# Patient Record
Sex: Male | Born: 1960 | Race: White | Hispanic: No | Marital: Married | Smoking: Former smoker
Health system: Southern US, Community
[De-identification: ages and names within clinical notes are randomized; demographics above are authoritative.]

## PROBLEM LIST (undated history)

## (undated) DIAGNOSIS — K219 Gastro-esophageal reflux disease without esophagitis: Secondary | ICD-10-CM

## (undated) DIAGNOSIS — K759 Inflammatory liver disease, unspecified: Secondary | ICD-10-CM

## (undated) DIAGNOSIS — Z87442 Personal history of urinary calculi: Secondary | ICD-10-CM

## (undated) DIAGNOSIS — D649 Anemia, unspecified: Secondary | ICD-10-CM

## (undated) DIAGNOSIS — I10 Essential (primary) hypertension: Secondary | ICD-10-CM

## (undated) DIAGNOSIS — G4730 Sleep apnea, unspecified: Secondary | ICD-10-CM

## (undated) DIAGNOSIS — L309 Dermatitis, unspecified: Secondary | ICD-10-CM

## (undated) DIAGNOSIS — R112 Nausea with vomiting, unspecified: Secondary | ICD-10-CM

## (undated) DIAGNOSIS — Z9889 Other specified postprocedural states: Secondary | ICD-10-CM

## (undated) DIAGNOSIS — C801 Malignant (primary) neoplasm, unspecified: Secondary | ICD-10-CM

## (undated) HISTORY — DX: Gastro-esophageal reflux disease without esophagitis: K21.9

## (undated) HISTORY — DX: Personal history of urinary calculi: Z87.442

## (undated) HISTORY — DX: Anemia, unspecified: D64.9

## (undated) HISTORY — DX: Essential (primary) hypertension: I10

---

## 1992-08-22 HISTORY — PX: WISDOM TOOTH EXTRACTION: SHX21

## 2008-06-25 ENCOUNTER — Encounter: Payer: Self-pay | Admitting: Internal Medicine

## 2008-06-30 ENCOUNTER — Ambulatory Visit: Payer: Self-pay

## 2008-06-30 ENCOUNTER — Encounter: Payer: Self-pay | Admitting: Internal Medicine

## 2008-07-18 ENCOUNTER — Ambulatory Visit: Payer: Self-pay | Admitting: Internal Medicine

## 2008-07-18 DIAGNOSIS — Z87442 Personal history of urinary calculi: Secondary | ICD-10-CM

## 2008-07-18 DIAGNOSIS — B1910 Unspecified viral hepatitis B without hepatic coma: Secondary | ICD-10-CM | POA: Insufficient documentation

## 2008-07-18 DIAGNOSIS — D649 Anemia, unspecified: Secondary | ICD-10-CM

## 2008-07-18 DIAGNOSIS — J45909 Unspecified asthma, uncomplicated: Secondary | ICD-10-CM | POA: Insufficient documentation

## 2008-07-18 DIAGNOSIS — D509 Iron deficiency anemia, unspecified: Secondary | ICD-10-CM

## 2008-07-18 LAB — CONVERTED CEMR LAB
Albumin: 3.3 g/dL — ABNORMAL LOW (ref 3.5–5.2)
Alkaline Phosphatase: 88 units/L (ref 39–117)
CO2: 29 meq/L (ref 19–32)
Eosinophils Relative: 2.7 % (ref 0.0–5.0)
GFR calc Af Amer: 103 mL/min
GFR calc non Af Amer: 86 mL/min
Glucose, Bld: 129 mg/dL — ABNORMAL HIGH (ref 70–99)
HCT: 41.3 % (ref 39.0–52.0)
Hepatitis B Surface Ag: POSITIVE — AB
INR: 1.1 — ABNORMAL HIGH (ref 0.8–1.0)
Monocytes Absolute: 0.5 10*3/uL (ref 0.1–1.0)
Monocytes Relative: 15.5 % — ABNORMAL HIGH (ref 3.0–12.0)
Neutrophils Relative %: 39.1 % — ABNORMAL LOW (ref 43.0–77.0)
Platelets: 199 10*3/uL (ref 150–400)
Potassium: 4.1 meq/L (ref 3.5–5.1)
RBC / HPF: NONE SEEN
RBC: 4.61 M/uL (ref 4.22–5.81)
Sodium: 139 meq/L (ref 135–145)
Specific Gravity, Urine: 1.015 (ref 1.000–1.03)
Total Protein, Urine: NEGATIVE mg/dL
Total Protein: 6.7 g/dL (ref 6.0–8.3)
Urine Glucose: NEGATIVE mg/dL
WBC: 3 10*3/uL — ABNORMAL LOW (ref 4.5–10.5)

## 2008-07-20 ENCOUNTER — Encounter: Payer: Self-pay | Admitting: Internal Medicine

## 2008-08-26 ENCOUNTER — Ambulatory Visit: Payer: Self-pay | Admitting: Internal Medicine

## 2008-08-26 LAB — CONVERTED CEMR LAB
ALT: 25 units/L (ref 0–53)
AST: 25 units/L (ref 0–37)
Albumin: 3.5 g/dL (ref 3.5–5.2)
Basophils Absolute: 0 10*3/uL (ref 0.0–0.1)
Basophils Relative: 0.5 % (ref 0.0–3.0)
Eosinophils Relative: 2.2 % (ref 0.0–5.0)
Hemoglobin: 15 g/dL (ref 13.0–17.0)
Hep A Total Ab: NEGATIVE
Hepatitis B Surface Ag: POSITIVE — AB
Lymphocytes Relative: 22.7 % (ref 12.0–46.0)
MCHC: 35.5 g/dL (ref 30.0–36.0)
Neutro Abs: 3.8 10*3/uL (ref 1.4–7.7)
RBC: 4.62 M/uL (ref 4.22–5.81)
Total Protein: 6.5 g/dL (ref 6.0–8.3)

## 2008-08-28 ENCOUNTER — Encounter: Payer: Self-pay | Admitting: Internal Medicine

## 2008-11-03 ENCOUNTER — Ambulatory Visit: Payer: Self-pay | Admitting: Internal Medicine

## 2008-11-03 LAB — CONVERTED CEMR LAB
ALT: 19 units/L (ref 0–53)
AST: 19 units/L (ref 0–37)
Albumin: 3.6 g/dL (ref 3.5–5.2)
Alkaline Phosphatase: 33 units/L — ABNORMAL LOW (ref 39–117)
Total Protein: 6.4 g/dL (ref 6.0–8.3)

## 2008-11-05 ENCOUNTER — Encounter: Payer: Self-pay | Admitting: Internal Medicine

## 2008-11-24 ENCOUNTER — Emergency Department (HOSPITAL_COMMUNITY): Admission: EM | Admit: 2008-11-24 | Discharge: 2008-11-24 | Payer: Self-pay | Admitting: Emergency Medicine

## 2010-03-22 ENCOUNTER — Ambulatory Visit: Payer: Self-pay | Admitting: Internal Medicine

## 2010-03-22 DIAGNOSIS — A09 Infectious gastroenteritis and colitis, unspecified: Secondary | ICD-10-CM | POA: Insufficient documentation

## 2010-03-22 LAB — CONVERTED CEMR LAB
ALT: 19 units/L (ref 0–53)
AST: 18 units/L (ref 0–37)
BUN: 16 mg/dL (ref 6–23)
Basophils Absolute: 0 10*3/uL (ref 0.0–0.1)
Basophils Relative: 0.3 % (ref 0.0–3.0)
CO2: 28 meq/L (ref 19–32)
Creatinine, Ser: 0.8 mg/dL (ref 0.4–1.5)
Eosinophils Absolute: 0.1 10*3/uL (ref 0.0–0.7)
GFR calc non Af Amer: 111.35 mL/min (ref >60)
HCT: 41.1 % (ref 39.0–52.0)
Hemoglobin: 14.5 g/dL (ref 13.0–17.0)
Hep A Total Ab: POSITIVE — AB
Hep B S Ab: POSITIVE — AB
Lymphs Abs: 1.6 10*3/uL (ref 0.7–4.0)
MCHC: 35.3 g/dL (ref 30.0–36.0)
MCV: 91 fL (ref 78.0–100.0)
Neutro Abs: 8.5 10*3/uL — ABNORMAL HIGH (ref 1.4–7.7)
RDW: 13 % (ref 11.5–14.6)
Total Bilirubin: 0.6 mg/dL (ref 0.3–1.2)

## 2010-03-23 ENCOUNTER — Encounter: Payer: Self-pay | Admitting: Internal Medicine

## 2010-03-23 ENCOUNTER — Telehealth: Payer: Self-pay | Admitting: Internal Medicine

## 2010-03-24 ENCOUNTER — Encounter: Payer: Self-pay | Admitting: Internal Medicine

## 2010-09-01 ENCOUNTER — Encounter: Payer: Self-pay | Admitting: Internal Medicine

## 2010-09-01 ENCOUNTER — Ambulatory Visit
Admission: RE | Admit: 2010-09-01 | Discharge: 2010-09-01 | Payer: Self-pay | Source: Home / Self Care | Attending: Internal Medicine | Admitting: Internal Medicine

## 2010-09-01 DIAGNOSIS — L03119 Cellulitis of unspecified part of limb: Secondary | ICD-10-CM

## 2010-09-01 DIAGNOSIS — A4902 Methicillin resistant Staphylococcus aureus infection, unspecified site: Secondary | ICD-10-CM | POA: Insufficient documentation

## 2010-09-01 DIAGNOSIS — L02419 Cutaneous abscess of limb, unspecified: Secondary | ICD-10-CM | POA: Insufficient documentation

## 2010-09-02 ENCOUNTER — Encounter: Payer: Self-pay | Admitting: Internal Medicine

## 2010-09-02 ENCOUNTER — Ambulatory Visit
Admission: RE | Admit: 2010-09-02 | Discharge: 2010-09-02 | Payer: Self-pay | Source: Home / Self Care | Attending: Internal Medicine | Admitting: Internal Medicine

## 2010-09-21 NOTE — Letter (Signed)
Summary: Results Follow-up Letter  [facility]  [address] [place], [place] [address]   Phone: [phone]  Fax: [phone]    03/24/2010  [address] [place], [place]  [address]  Dear Mr. STYER,   The following are the results of your recent test(s):  Test     Result     stool test     positive for giardia, the antibiotic should kill it  _________________________________________________________  Please call for an appointment in 2-3 weeks _________________________________________________________ _________________________________________________________ _________________________________________________________  Sincerely,  Sanda Linger MD [facility]

## 2010-09-21 NOTE — Assessment & Plan Note (Signed)
Summary: DIARRHEA/NWS   Vital Signs:  Patient profile:   50 year old male Height:      71 inches Weight:      190 pounds BMI:     26.60 O2 Sat:      95 % on Room air Temp:     97.8 degrees F oral Pulse rate:   65 / minute Pulse rhythm:   regular Resp:     16 per minute BP sitting:   126 / 70  (left arm) Cuff size:   large  Vitals Entered By: Rock Nephew CMA (March 22, 2010 10:14 AM)  Nutrition Counseling: Patient's BMI is greater than 25 and therefore counseled on weight management options.  O2 Flow:  Room air CC: Patient c/o diarrhea. nasua x 2wk, Diarrhea Is Patient Diabetic? No Pain Assessment Patient in pain? no        Primary Care Provider:  Etta Grandchild MD  CC:  Patient c/o diarrhea. nasua x 2wk and Diarrhea.  History of Present Illness:  Diarrhea      This is a 50 year old man who presents with Diarrhea.  The symptoms began 2 weeks ago.  The severity is described as moderate.  The patient reports >6 stools per day, watery/unformed stools, malodorous stools, fecal urgency, fecal soiling, and abrupt onset of symptoms, but denies voluminous stools, blood in stool, mucus in stool, greasy stools, alternating diarrhea/constipation, nocturnal diarrhea, fasting diarrhea, bloating, gassiness, and gradual onset of symptoms.  Associated symptoms include abdominal pain and nausea.  The patient denies fever, abdominal cramps, vomiting, lightheadedness, increased thirst, weight loss, joint pains, mouth ulcers, and eye redness.  The symptoms are better with hypomotility agents.  Patient's risk factors for diarrhea include recent antibiotic use.    Preventive Screening-Counseling & Management  Alcohol-Tobacco     Alcohol drinks/day: 1     Alcohol type: spirits     >5/day in last 3 mos: no     Alcohol Counseling: to STOP drinking     Feels need to cut down: no     Feels annoyed by complaints: no     Feels guilty re: drinking: no     Needs 'eye opener' in am: no  Smoking Status: quit > 6 months     Year Quit: 1999     Pack years: 15     Passive Smoke Exposure: no     Tobacco Counseling: to remain off tobacco products  Hep-HIV-STD-Contraception     Hepatitis Risk: risk noted     Hepatitis Risk Counseling: to avoid increased hepatitis risk     HIV Risk: no risk noted     STD Risk: no risk noted      Sexual History:  currently monogamous.        Drug Use:  no.        Blood Transfusions:  no.    Clinical Review Panels:  CBC   WBC:  5.6 (08/26/2008)   RBC:  4.62 (08/26/2008)   Hgb:  15.0 (08/26/2008)   Hct:  42.3 (08/26/2008)   Platelets:  218 (08/26/2008)   MCV  91.5 (08/26/2008)   MCHC  35.5 (08/26/2008)   RDW  13.2 (08/26/2008)   PMN:  66.7 (08/26/2008)   Lymphs:  22.7 (08/26/2008)   Monos:  7.9 (08/26/2008)   Eosinophils:  2.2 (08/26/2008)   Basophil:  0.5 (08/26/2008)  Complete Metabolic Panel   Glucose:  129 (07/18/2008)   Sodium:  139 (07/18/2008)   Potassium:  4.1 (  07/18/2008)   Chloride:  105 (07/18/2008)   CO2:  29 (07/18/2008)   BUN:  10 (07/18/2008)   Creatinine:  1.0 (07/18/2008)   Albumin:  3.6 (11/03/2008)   Total Protein:  6.4 (11/03/2008)   Calcium:  8.8 (07/18/2008)   Total Bili:  1.2 (11/03/2008)   Alk Phos:  33 (11/03/2008)   SGPT (ALT):  19 (11/03/2008)   SGOT (AST):  19 (11/03/2008)   Medications Prior to Update: 1)  None  Current Medications (verified): 1)  Metronidazole 500 Mg Tabs (Metronidazole) .... One By Mouth Three Times A Day With Food-No Alcohol!  Allergies (verified): 1)  ! Penicillin 2)  ! Bactrim 3)  ! Sulfa  Past History:  Past Medical History: Last updated: 07/18/2008 Anemia-iron deficiency Asthma Hypertension Nephrolithiasis, hx of  Past Surgical History: Last updated: 07/18/2008 Denies surgical history  Family History: Last updated: 08/26/2008 Family History Diabetes 1st degree relative Family History of Colon CA 1st degree relative <60  Social History: Last  updated: 03/22/2010 Occupation: Programme researcher, broadcasting/film/video Married Drug use-no Regular exercise-no Alcohol use-yes  Risk Factors: Alcohol Use: 1 (03/22/2010) >5 drinks/d w/in last 3 months: no (03/22/2010) Exercise: no (07/18/2008)  Risk Factors: Smoking Status: quit > 6 months (03/22/2010) Passive Smoke Exposure: no (03/22/2010)  Social History: Occupation: Programme researcher, broadcasting/film/video Married Drug use-no Regular exercise-no Alcohol use-yes Sexual History:  currently monogamous Blood Transfusions:  no  Review of Systems       The patient complains of weight loss.  The patient denies anorexia, fever, weight gain, chest pain, syncope, dyspnea on exertion, peripheral edema, prolonged cough, headaches, hemoptysis, melena, hematochezia, severe indigestion/heartburn, suspicious skin lesions, difficulty walking, depression, enlarged lymph nodes, angioedema, and testicular masses.   GI:  Complains of abdominal pain, diarrhea, gas, and nausea; denies bloody stools, hemorrhoids, indigestion, loss of appetite, vomiting, vomiting blood, and yellowish skin color. Heme:  Denies abnormal bruising, bleeding, enlarge lymph nodes, fevers, pallor, and skin discoloration.  Physical Exam  General:  alert, well-developed, and overweight-appearing.   Eyes:  no icterus Mouth:  Oral mucosa and oropharynx without lesions or exudates.  Teeth in good repair. Neck:  supple, full ROM, no masses, and no thyromegaly.   Lungs:  Normal respiratory effort, chest expands symmetrically. Lungs are clear to auscultation, no crackles or wheezes. Heart:  Normal rate and regular rhythm. S1 and S2 normal without gallop, murmur, click, rub or other extra sounds. Abdomen:  soft, non-tender, normal bowel sounds, no distention, no masses, no hepatomegaly, and no splenomegaly.   Msk:  No deformity or scoliosis noted of thoracic or lumbar spine.   Pulses:  R and L carotid,radial,femoral,dorsalis pedis and posterior tibial  pulses are full and equal bilaterally Extremities:  No clubbing, cyanosis, edema, or deformity noted with normal full range of motion of all joints.   Neurologic:  alert & oriented X3, cranial nerves II-XII intact, strength normal in all extremities, sensation intact to light touch, sensation intact to pinprick, gait normal, DTRs symmetrical and normal, finger-to-nose normal, heel-to-shin normal, toes down bilaterally on Babinski, and Romberg negative.   Skin:  turgor normal, no rashes, no suspicious lesions, no ecchymoses, no petechiae, no purpura, no ulcerations, no edema, and excessive tan.   Cervical Nodes:  no anterior cervical adenopathy and no posterior cervical adenopathy.   Axillary Nodes:  no R axillary adenopathy and no L axillary adenopathy.   Inguinal Nodes:  no R inguinal adenopathy and no L inguinal adenopathy.   Psych:  Cognition and judgment  appear intact. Alert and cooperative with normal attention span and concentration. No apparent delusions, illusions, hallucinations   Impression & Recommendations:  Problem # 1:  INFECTIOUS DIARRHEA (ICD-009.2) Assessment New  start flagyl, he recently took some left over Avelox so i don't think he has a routine bacterial pathogen, C. diff and parasites are a concern Orders: T-Culture, C-Diff Toxin A/B ([address]60[address]) T-Stool Giardia / Crypto- EIA (09811) T-Stool for O&P (91478-29562)  Discussed symptom control and diet. Call if worsening of symptoms or signs of dehydration.   Problem # 2:  VIRL HEP B W/O HEP COMA ACUT/UNS W/O HEP DELTA (ICD-070.30) Assessment: Unchanged  Orders: TLB-CBC Platelet - w/Differential (85025-CBCD) TLB-CMP (Comprehensive Metabolic Pnl) (80053-COMP) T-Hepatitis B Surface Antibody (13086-57846) T-Hepatitis B Surface Antigen (96295-28413) T-Hepatitis Be Antigen (24401-02725) T-Hepatitis A Antibody (36644-03474)  Problem # 3:  ANEMIA-IRON DEFICIENCY (ICD-280.9) Assessment: Unchanged  Orders: TLB-CBC  Platelet - w/Differential (85025-CBCD) TLB-CMP (Comprehensive Metabolic Pnl) (80053-COMP) T-Hepatitis B Surface Antibody (25956-38756) T-Hepatitis B Surface Antigen (43329-51884) T-Hepatitis Be Antigen (16606-30160) T-Hepatitis A Antibody (10932-35573)  Hgb: 15.0 (08/26/2008)   Hct: 42.3 (08/26/2008)   Platelets: 218 (08/26/2008) RBC: 4.62 (08/26/2008)   RDW: 13.2 (08/26/2008)   WBC: 5.6 (08/26/2008) MCV: 91.5 (08/26/2008)   MCHC: 35.5 (08/26/2008) Iron: 160 (07/18/2008)   % Sat: 45.4 (07/18/2008)  Complete Medication List: 1)  Metronidazole 500 Mg Tabs (Metronidazole) .... One by mouth three times a day with food-no alcohol!  Patient Instructions: 1)  Please schedule a follow-up appointment in 2 weeks. 2)  Take your antibiotic as prescribed until ALL of it is gone, but stop if you develop a rash or swelling and contact our office as soon as possible. 3)  teh main problem with gastroenteritis is dehydration. Drink plenty of fluids and take solids as you feel better. If you are unable to keep anything down and/or you show signs of dehydration(dry/cracked lips, lack of tears, not urinating, very sleepy), call our office. Prescriptions: METRONIDAZOLE 500 MG TABS (METRONIDAZOLE) One by mouth three times a day with food-NO ALCOHOL!  #30 x 0   Entered and Authorized by:   Etta Grandchild MD   Signed by:   Etta Grandchild MD on 03/22/2010   Method used:   Print then Give to Patient   RxID:   [id number]   Preventive Care Screening  Last Tetanus Booster:    Date:  08/23/2007    Results:  Historical

## 2010-09-21 NOTE — Progress Notes (Signed)
Summary: nausea/ Matthew Richardson pt  Phone Note Call from Patient   Caller: Spouse Summary of Call: Per pt wife, he was seen 03/22/10 for office vist and failed to request rx for nausea. [facility] [place]. Please advise thank or should this wait until MD return? Thanks Initial call taken by: Rock Nephew CMA,  March 23, 2010 1:03 PM  Follow-up for Phone Call        OK Prometh #12 Follow-up by: Tresa Garter MD,  March 23, 2010 5:14 PM  Additional Follow-up for Phone Call Additional follow up Details #1::        left mess to call office back w/which [facility] [place]................Marland KitchenLamar Sprinkles, CMA  March 23, 2010 5:16 PM   Pt informed  Additional Follow-up by: Lamar Sprinkles, CMA,  March 24, 2010 11:18 AM    New/Updated Medications: PROMETHAZINE HCL 25 MG TABS (PROMETHAZINE HCL) 1 by mouth four times a day as needed nausea Prescriptions: PROMETHAZINE HCL 25 MG TABS (PROMETHAZINE HCL) 1 by mouth four times a day as needed nausea  #12 x 0   Entered by:   Lamar Sprinkles, CMA   Authorized by:   Tresa Garter MD   Signed by:   Lamar Sprinkles, CMA on 03/24/2010   Method used:   [facility]ically to        [facility] [phone]* (retail)       [address] [place], [place]  [address]       Ph: [phone] or [phone]       Fax: [phone]   RxID:   [id number] PROMETHAZINE HCL 25 MG TABS (PROMETHAZINE HCL) 1 by mouth four times a day as needed nausea  #12 x 0   Entered and Authorized by:   Tresa Garter MD   Signed by:   Tresa Garter MD on 03/23/2010   Method used:   Print then Give to Patient   RxID:   [id number]

## 2010-09-23 NOTE — Assessment & Plan Note (Signed)
Summary: 1-2 day follow up-lb   Vital Signs:  Patient profile:   50 year old male Height:      71 inches Weight:      191 pounds O2 Sat:      98 % on Room air Temp:     98.3 degrees F oral Pulse rate:   60 / minute Pulse rhythm:   regular Resp:     16 per minute BP sitting:   124 / 78  (left arm)  Vitals Entered By: Rock Nephew CMA (September 02, 2010 8:22 AM)  O2 Flow:  Room air  Primary Care Provider:  Etta Grandchild MD   History of Present Illness: He returns for f/up and to have the packing removed. The area feels better to him with less pain, redness,  and swelling. There has been no drainage that has seaped thru the gauze dressing. He is tolerating his meds well.  Preventive Screening-Counseling & Management  Alcohol-Tobacco     Alcohol drinks/day: 1     Alcohol type: spirits     >5/day in last 3 mos: no     Alcohol Counseling: to STOP drinking     Feels need to cut down: no     Feels annoyed by complaints: no     Feels guilty re: drinking: no     Needs 'eye opener' in am: no     Smoking Status: quit > 6 months     Year Quit: 1999     Pack years: 15     Passive Smoke Exposure: no     Tobacco Counseling: to remain off tobacco products  Hep-HIV-STD-Contraception     Hepatitis Risk: risk noted     Hepatitis Risk Counseling: to avoid increased hepatitis risk     HIV Risk: no risk noted     STD Risk: no risk noted      Sexual History:  currently monogamous.        Drug Use:  no.        Blood Transfusions:  no.    Clinical Review Panels:  Immunizations   Last Tetanus Booster:  Historical (08/23/2007)   Last H1N1 Vaccine 1:  H1N1 vaccine G code (08/26/2008)  Diabetes Management   Creatinine:  0.8 (03/22/2010)  CBC   WBC:  10.7 (03/22/2010)   RBC:  4.52 (03/22/2010)   Hgb:  14.5 (03/22/2010)   Hct:  41.1 (03/22/2010)   Platelets:  325.0 (03/22/2010)   MCV  91.0 (03/22/2010)   MCHC  35.3 (03/22/2010)   RDW  13.0 (03/22/2010)   PMN:  79.7  (03/22/2010)   Lymphs:  14.9 (03/22/2010)   Monos:  4.1 (03/22/2010)   Eosinophils:  1.0 (03/22/2010)   Basophil:  0.3 (03/22/2010)  Complete Metabolic Panel   Glucose:  116 (03/22/2010)   Sodium:  141 (03/22/2010)   Potassium:  4.0 (03/22/2010)   Chloride:  105 (03/22/2010)   CO2:  28 (03/22/2010)   BUN:  16 (03/22/2010)   Creatinine:  0.8 (03/22/2010)   Albumin:  3.1 (03/22/2010)   Total Protein:  5.8 (03/22/2010)   Calcium:  8.4 (03/22/2010)   Total Bili:  0.6 (03/22/2010)   Alk Phos:  68 (03/22/2010)   SGPT (ALT):  19 (03/22/2010)   SGOT (AST):  18 (03/22/2010)   Medications Prior to Update: 1)  Promethazine Hcl 25 Mg Tabs (Promethazine Hcl) .[name].. 1 By Mouth Four Times A Day As Needed Nausea 2)  Doxycycline Hyclate 100 Mg Tabs (Doxycycline Hyclate) .... One  By Mouth Two Times A Day For 14 Days 3)  Rifampin 300 Mg Caps (Rifampin) .... One By Mouth Two Times A Day For 7 Days 4)  Percocet 7.5-325 Mg Tabs (Oxycodone-Acetaminophen) .... One By Mouth Three Times A Day As Needed For Pain  Current Medications (verified): 1)  Promethazine Hcl 25 Mg Tabs (Promethazine Hcl) .[name].. 1 By Mouth Four Times A Day As Needed Nausea 2)  Doxycycline Hyclate 100 Mg Tabs (Doxycycline Hyclate) .... One By Mouth Two Times A Day For 14 Days 3)  Rifampin 300 Mg Caps (Rifampin) .... One By Mouth Two Times A Day For 7 Days 4)  Percocet 7.5-325 Mg Tabs (Oxycodone-Acetaminophen) .... One By Mouth Three Times A Day As Needed For Pain  Allergies (verified): 1)  ! Penicillin 2)  ! Bactrim 3)  ! Sulfa  Past History:  Past Medical History: Last updated: 07/18/2008 Anemia-iron deficiency Asthma Hypertension Nephrolithiasis, hx of  Past Surgical History: Last updated: 07/18/2008 Denies surgical history  Family History: Last updated: 08/26/2008 Family History Diabetes 1st degree relative Family History of Colon CA 1st degree relative <60  Social History: Last updated: 03/22/2010 Occupation:  Programme researcher, broadcasting/film/video Married Drug use-no Regular exercise-no Alcohol use-yes  Risk Factors: Alcohol Use: 1 (09/02/2010) >5 drinks/d w/in last 3 months: no (09/02/2010) Exercise: no (07/18/2008)  Risk Factors: Smoking Status: quit > 6 months (09/02/2010) Passive Smoke Exposure: no (09/02/2010)  Family History: Reviewed history from 08/26/2008 and no changes required. Family History Diabetes 1st degree relative Family History of Colon CA 1st degree relative <60  Social History: Reviewed history from 03/22/2010 and no changes required. Occupation: Programme researcher, broadcasting/film/video Married Drug use-no Regular exercise-no Alcohol use-yes  Review of Systems  The patient denies anorexia, fever, weight loss, weight gain, chest pain, abdominal pain, suspicious skin lesions, and enlarged lymph nodes.   General:  Denies chills, fatigue, fever, loss of appetite, malaise, and sweats.  Physical Exam  General:  alert, well-developed, and overweight-appearing.   Mouth:  Oral mucosa and oropharynx without lesions or exudates.  Teeth in good repair. Neck:  supple, full ROM, no masses, and no thyromegaly.   Lungs:  Normal respiratory effort, chest expands symmetrically. Lungs are clear to auscultation, no crackles or wheezes. Heart:  Normal rate and regular rhythm. S1 and S2 normal without gallop, murmur, click, rub or other extra sounds. Abdomen:  soft, non-tender, normal bowel sounds, no distention, no masses, no hepatomegaly, and no splenomegaly.   Msk:  the area over his left upper/inner thigh was examined and the area looks better with less erythema, swelling, warmth, ttp, induration. The iodoform packing was removed and no additional exudate was seen. The area was covered with a gauze dressing. Pulses:  R and L carotid,radial,femoral,dorsalis pedis and posterior tibial pulses are full and equal bilaterally Extremities:  No clubbing, cyanosis, edema, or deformity noted with normal full  range of motion of all joints.   Skin:  turgor normal, no rashes, no suspicious lesions, no ecchymoses, no petechiae, no purpura, no ulcerations, no edema, and excessive tan.   Inguinal Nodes:  no R inguinal adenopathy and no L inguinal adenopathy.   Psych:  Cognition and judgment appear intact. Alert and cooperative with normal attention span and concentration. No apparent delusions, illusions, hallucinations   Impression & Recommendations:  Problem # 1:  CELLULITIS, LEFT LEG (ICD-682.6) Assessment Improved  His updated medication list for this problem includes:    Doxycycline Hyclate 100 Mg Tabs (Doxycycline hyclate) ..... One by  mouth two times a day for 14 days    Rifampin 300 Mg Caps (Rifampin) ..... 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 1 by mouth four times a day as needed nausea 2)  Doxycycline Hyclate 100 Mg Tabs (Doxycycline hyclate) .... One by mouth two times a day for 14 days 3)  Rifampin 300 Mg Caps (Rifampin) .... One by mouth two times a day for 7 days 4)  Percocet 7.5-325 Mg Tabs (Oxycodone-acetaminophen) .... One by mouth three times a day as needed for pain  Patient Instructions: 1)  Please schedule a follow-up appointment in 5 days. 2)  Take your antibiotic as prescribed until ALL of it is gone, but stop if you develop a rash or swelling and contact our office as soon as possible.   Orders Added: 1)  Est. Patient Level III [08657]

## 2010-09-23 NOTE — Letter (Signed)
Summary: Out of Work  [facility]  [address] [place], [place] [address]   Phone: [phone]  Fax: [phone]    September 01, 2010   Employee:  Matthew Richardson    To Whom It May Concern:   For Medical reasons, please excuse the above named employee from work for the following dates:  Start:   08/31/10  End:   09/01/10  If you need additional information, please feel free to contact our office.         Sincerely,    Etta Grandchild MD

## 2010-09-23 NOTE — Letter (Signed)
Summary: Out of Work  [facility]  [address] [place], [place] [address]   Phone: [phone]  Fax: [phone]    September 02, 2010   Employee:  Gerri Spore Meloni    To Whom It May Concern:   For Medical reasons, please excuse the above named employee from work for the following dates:  Start:   09/01/10  End:   09/03/10  If you need additional information, please feel free to contact our office.         Sincerely,    Etta Grandchild MD

## 2010-09-23 NOTE — Assessment & Plan Note (Signed)
Summary: L LEG/GROIN-RED SWOLLEN KNOT  -PER SARAH OK TO SCHED--STC   Vital Signs:  Patient profile:   50 year old male Height:      71 inches Weight:      191.13 pounds BMI:     26.75 O2 Sat:      95 % on Room air Temp:     98.5 degrees F oral Pulse rate:   50 / minute Pulse rhythm:   regular Resp:     16 per minute BP sitting:   120 / 80  (left arm) Cuff size:   large  Vitals Entered By: Rock Nephew CMA (September 01, 2010 8:54 AM)  Nutrition Counseling: Patient's BMI is greater than 25 and therefore counseled on weight management options.  O2 Flow:  Room air CC: Patient c/o painful cyst in groin area Is Patient Diabetic? No Pain Assessment Patient in pain? yes     Location: thigh  Does patient need assistance? Functional Status Self care Ambulation Normal   Primary Care Provider:  Etta Grandchild MD  CC:  Patient c/o painful cyst in groin area.  History of Present Illness: He returns c/o a painful, swollen area over his left upper/inner thigh for 5 days. He has had several boils over his skin in the thigh and groin that have not become so large/red/painful but this one just keeps growing.  Preventive Screening-Counseling & Management  Alcohol-Tobacco     Alcohol drinks/day: 1     Alcohol type: spirits     >5/day in last 3 mos: no     Alcohol Counseling: to STOP drinking     Feels need to cut down: no     Feels annoyed by complaints: no     Feels guilty re: drinking: no     Needs 'eye opener' in am: no     Smoking Status: quit > 6 months     Year Quit: 1999     Pack years: 15     Passive Smoke Exposure: no     Tobacco Counseling: to remain off tobacco products  Hep-HIV-STD-Contraception     Hepatitis Risk: risk noted     Hepatitis Risk Counseling: to avoid increased hepatitis risk     HIV Risk: no risk noted     STD Risk: no risk noted      Sexual History:  currently monogamous.        Drug Use:  no.        Blood Transfusions:  no.    Clinical  Review Panels:  Immunizations   Last Tetanus Booster:  Historical (08/23/2007)   Last H1N1 Vaccine 1:  H1N1 vaccine G code (08/26/2008)  Diabetes Management   Creatinine:  0.8 (03/22/2010)  CBC   WBC:  10.7 (03/22/2010)   RBC:  4.52 (03/22/2010)   Hgb:  14.5 (03/22/2010)   Hct:  41.1 (03/22/2010)   Platelets:  325.0 (03/22/2010)   MCV  91.0 (03/22/2010)   MCHC  35.3 (03/22/2010)   RDW  13.0 (03/22/2010)   PMN:  79.7 (03/22/2010)   Lymphs:  14.9 (03/22/2010)   Monos:  4.1 (03/22/2010)   Eosinophils:  1.0 (03/22/2010)   Basophil:  0.3 (03/22/2010)  Complete Metabolic Panel   Glucose:  116 (03/22/2010)   Sodium:  141 (03/22/2010)   Potassium:  4.0 (03/22/2010)   Chloride:  105 (03/22/2010)   CO2:  28 (03/22/2010)   BUN:  16 (03/22/2010)   Creatinine:  0.8 (03/22/2010)   Albumin:  3.1 (03/22/2010)  Total Protein:  5.8 (03/22/2010)   Calcium:  8.4 (03/22/2010)   Total Bili:  0.6 (03/22/2010)   Alk Phos:  68 (03/22/2010)   SGPT (ALT):  19 (03/22/2010)   SGOT (AST):  18 (03/22/2010)   Medications Prior to Update: 1)  Promethazine Hcl 25 Mg Tabs (Promethazine Hcl) .[name].. 1 By Mouth Four Times A Day As Needed Nausea  Current Medications (verified): 1)  Promethazine Hcl 25 Mg Tabs (Promethazine Hcl) .[name].. 1 By Mouth Four Times A Day As Needed Nausea 2)  Doxycycline Hyclate 100 Mg Tabs (Doxycycline Hyclate) .... One By Mouth Two Times A Day For 14 Days 3)  Rifampin 300 Mg Caps (Rifampin) .... One By Mouth Two Times A Day For 7 Days 4)  Percocet 7.5-325 Mg Tabs (Oxycodone-Acetaminophen) .... One By Mouth Three Times A Day As Needed For Pain  Allergies (verified): 1)  ! Penicillin 2)  ! Bactrim 3)  ! Sulfa  Past History:  Past Medical History: Last updated: 07/18/2008 Anemia-iron deficiency Asthma Hypertension Nephrolithiasis, hx of  Past Surgical History: Last updated: 07/18/2008 Denies surgical history  Family History: Last updated: 08/26/2008 Family History  Diabetes 1st degree relative Family History of Colon CA 1st degree relative <60  Social History: Last updated: 03/22/2010 Occupation: Programme researcher, broadcasting/film/video Married Drug use-no Regular exercise-no Alcohol use-yes  Risk Factors: Alcohol Use: 1 (09/01/2010) >5 drinks/d w/in last 3 months: no (09/01/2010) Exercise: no (07/18/2008)  Risk Factors: Smoking Status: quit > 6 months (09/01/2010) Passive Smoke Exposure: no (09/01/2010)  Family History: Reviewed history from 08/26/2008 and no changes required. Family History Diabetes 1st degree relative Family History of Colon CA 1st degree relative <60  Social History: Reviewed history from 03/22/2010 and no changes required. Occupation: Programme researcher, broadcasting/film/video Married Drug use-no Regular exercise-no Alcohol use-yes  Review of Systems  The patient denies anorexia, fever, weight loss, weight gain, chest pain, syncope, dyspnea on exertion, peripheral edema, prolonged cough, headaches, hemoptysis, abdominal pain, hematuria, abnormal bleeding, and enlarged lymph nodes.    Physical Exam  General:  alert, well-developed, and overweight-appearing.   Head:  normocephalic and atraumatic.   Eyes:  no icterus Mouth:  Oral mucosa and oropharynx without lesions or exudates.  Teeth in good repair. Neck:  supple, full ROM, no masses, and no thyromegaly.   Lungs:  Normal respiratory effort, chest expands symmetrically. Lungs are clear to auscultation, no crackles or wheezes. Heart:  Normal rate and regular rhythm. S1 and S2 normal without gallop, murmur, click, rub or other extra sounds. Abdomen:  soft, non-tender, normal bowel sounds, no distention, no masses, no hepatomegaly, and no splenomegaly.   Msk:  over his left upper/inner thigh there is a pustule surrounded by erythema, warmth, induration, fluctuance, and ttp but there is no streaking. in the groin and thigh area there are several small scabbed excoriations but no other areas  that are red or swollen. his pubic hair has been shaved. Additional Exam:  the area over the left upper inner thigh was cleaned with betadine then prepped and draped in sterile fashion, the was anesthesized with 2% lidocaine with epi, using 3 cc for anesthesia. then a 6 mm punch incision was made and it produced a copious amount of thick, purulent exudate. the opening was explored and it revealed several deep loculations and tracks that were disrupted and drained. the cavity was irrigated with H2O2 and packed with iodoform. there was no blood loss and he tolerated it well. a dressing was applied.  Impression & Recommendations:  Problem # 1:  CELLULITIS, LEFT LEG (ICD-682.6) Assessment New  His updated medication list for this problem includes:    Doxycycline Hyclate 100 Mg Tabs (Doxycycline hyclate) ..... One by mouth two times a day for 14 days    Rifampin 300 Mg Caps (Rifampin) ..... 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 1 by mouth four times a day as needed nausea 2)  Doxycycline Hyclate 100 Mg Tabs (Doxycycline hyclate) .... One by mouth two times a day for 14 days 3)  Rifampin 300 Mg Caps (Rifampin) .... One by mouth two times a day for 7 days 4)  Percocet 7.5-325 Mg Tabs (Oxycodone-acetaminophen) .... One by mouth three times a day as needed for pain  Other Orders: Specimen Handling ([id number])  Patient Instructions: 1)  Please schedule a follow-up appointment in 1-2 days. 2)  Take your antibiotic as prescribed until ALL of it is gone, but stop if you develop a rash or swelling and contact our office as soon as possible. Prescriptions: PROMETHAZINE HCL 25 MG TABS (PROMETHAZINE HCL) One by mouth three times a day as needed for  nausea/vomiting  #12 x 1   Entered and Authorized by:   Etta Grandchild MD   Signed by:   Etta Grandchild MD on 09/01/2010   Method used:   Print then Give to Patient   RxID:   [id number] PERCOCET 7.5-325 MG TABS (OXYCODONE-ACETAMINOPHEN) One by mouth three times a day as needed for pain  #12 x 0   Entered and Authorized by:   Etta Grandchild MD   Signed by:   Etta Grandchild MD on 09/01/2010   Method used:   Print then Give to Patient   RxID:   [id number] RIFAMPIN 300 MG CAPS (RIFAMPIN) One by mouth two times a day for 7 days  #14 x 1   Entered and Authorized by:   Etta Grandchild MD   Signed by:   Etta Grandchild MD on 09/01/2010   Method used:   Electronically to        [facility] [phone]* (retail)       [address]       [place], [place]  [address]       Ph: [phone] or [phone]       Fax: [phone]   RxID:   [id number] DOXYCYCLINE HYCLATE 100 MG TABS (DOXYCYCLINE HYCLATE) One by mouth two times a day for 14 days  #28 x 1   Entered and Authorized by:   Etta Grandchild MD   Signed by:   Etta Grandchild MD on 09/01/2010   Method used:   Electronically to        [facility] [phone]* (retail)       [address] [place], [place]  [address]       Ph: [phone] or [phone]       Fax: [phone]   RxID:   [id number]    Orders Added: 1)  T-Culture, Wound [[id number]] 2)  Specimen Handling [99000] 3)  I&D Abscess, Complex [10061] 4)  Est. Patient Level IV [id number]

## 2010-10-13 ENCOUNTER — Telehealth (INDEPENDENT_AMBULATORY_CARE_PROVIDER_SITE_OTHER): Payer: Self-pay | Admitting: *Deleted

## 2010-10-19 NOTE — Progress Notes (Signed)
  Phone Note Other Incoming   Request: Send information Summary of Call: Request for records received from [facility]. Request forwarded to [facility].  All records for the last 5 yrs needed-T. Yetta Barre

## 2011-02-02 ENCOUNTER — Encounter: Payer: Self-pay | Admitting: *Deleted

## 2011-02-02 ENCOUNTER — Ambulatory Visit: Payer: Self-pay | Admitting: Internal Medicine

## 2011-02-03 ENCOUNTER — Ambulatory Visit: Payer: Self-pay | Admitting: Internal Medicine

## 2011-04-29 ENCOUNTER — Ambulatory Visit (INDEPENDENT_AMBULATORY_CARE_PROVIDER_SITE_OTHER): Payer: BC Managed Care – PPO | Admitting: Internal Medicine

## 2011-04-29 ENCOUNTER — Encounter: Payer: Self-pay | Admitting: Internal Medicine

## 2011-04-29 ENCOUNTER — Ambulatory Visit (INDEPENDENT_AMBULATORY_CARE_PROVIDER_SITE_OTHER)
Admission: RE | Admit: 2011-04-29 | Discharge: 2011-04-29 | Disposition: A | Discharge status: Home / Self Care | Payer: BC Managed Care – PPO | Source: Ambulatory Visit | Attending: Internal Medicine | Admitting: Internal Medicine

## 2011-04-29 VITALS — BP 128/78 | HR 48 | Ht 71.0 in | Wt 194.0 lb

## 2011-04-29 DIAGNOSIS — J45990 Exercise induced bronchospasm: Secondary | ICD-10-CM

## 2011-04-29 DIAGNOSIS — J4520 Mild intermittent asthma, uncomplicated: Secondary | ICD-10-CM | POA: Insufficient documentation

## 2011-04-29 DIAGNOSIS — Z23 Encounter for immunization: Secondary | ICD-10-CM

## 2011-04-29 MED ORDER — MONTELUKAST SODIUM 10 MG PO TABS: 10.0000 mg | ORAL_TABLET | Freq: Every day | ORAL | Status: DC

## 2011-04-29 NOTE — Progress Notes (Signed)
Subjective:    Patient ID: Matthew Richardson, male    DOB: 04-21-1961, 50 y.o.   MRN: [id number]  HPI 04/29/11- 50 year old male former smoker who is the previous patient at the old practice 7 years ago, coming now to establish for renewal of medication. He is a runner and notes mild shortness of breath and chest tightness mainly associated with running on a seasonal basis, particularly in the fall. He also blames ragweed and mold exposures. Remote allergy evaluation by Dr Beaulah Dinning. Usually Singulair has been sufficient. He has not needed a metered inhaler at all. He rarely notices actual wheeze or cough, nasal congestion or sinus drainage. Last prednisone was years ago. He had smoked up to a pack and a half a day of cigarettes, ending in 2003. No significant family history of asthma or allergy.  Review of Systems Constitutional:   No-   weight loss, night sweats, fevers, chills, fatigue, lassitude. HEENT:   No-  headaches, difficulty swallowing, tooth/dental problems, sore throat,       No-  sneezing, itching, ear ache, nasal congestion, post nasal drip,  CV:  No-   chest pain, orthopnea, PND, swelling in lower extremities, anasarca, dizziness, palpitations Resp: No-   shortness of breath with exertion or at rest.              No-   productive cough,  No non-productive cough,  No-  coughing up of blood.              No-   change in color of mucus.  No- wheezing.   Skin: No-   rash or lesions. GI:  No-   heartburn, indigestion, abdominal pain, nausea, vomiting, diarrhea,                 change in bowel habits, loss of appetite GU: No-   dysuria, change in color of urine, no urgency or frequency.  No- flank pain. MS:  No-   joint pain or swelling.  No- decreased range of motion.  No- back pain. Neuro- grossly normal to observation, Or:  Psych:  No- change in mood or affect. No depression or anxiety.  No memory loss.      Objective:   Physical Exam General- Alert, Oriented, Affect-appropriate,  Distress- none acute; very fit appearing-note sinus bradycardia. Skin- rash-none, lesions- none, excoriation- none. Tan. Lymphadenopathy- none Head- atraumatic            Eyes- Gross vision intact, PERRLA, conjunctivae clear secretions            Ears- Hearing, canals normal            Nose- Clear, no-Septal dev, mucus, polyps, erosion, perforation             Throat- Mallampati II , mucosa clear , drainage- none, tonsils- atrophic Neck- flexible , trachea midline, no stridor , thyroid nl, carotid no bruit Chest - symmetrical excursion , unlabored           Heart/CV- RRR , no murmur , no gallop  , no rub, nl s1 s2                           - JVD- none , edema- none, stasis changes- none, varices- none           Lung- transient crackles right base posteriorly, cleared with second deep breath., wheeze- none, cough- none , dullness-none, rub- none  Chest wall-  Abd- tender-no, distended-no, bowel sounds-present, HSM- no Br/ Gen/ Rectal- Not done, not indicated Extrem- cyanosis- none, clubbing, none, atrophy- none, strength- nl Neuro- grossly intact to observation         Assessment & Plan:

## 2011-04-29 NOTE — Assessment & Plan Note (Addendum)
Mild intermittent. Past hx of siginificant smoking, so I will get a baseline CXR.  Refill singulair which he has found sufficient for intermittent use. We can expand the treatment tools as needed. The relationship to fall season suggest there may be an allergic or temperature component. Flu vax

## 2011-04-29 NOTE — Patient Instructions (Signed)
Singulair script sent- one daily as needed  Flu vax  Order- CXR- dx asthma

## 2011-05-04 ENCOUNTER — Telehealth: Payer: Self-pay | Admitting: Internal Medicine

## 2011-05-04 NOTE — Telephone Encounter (Signed)
PA for Montelukast initiated through [employer] at [phone]. Member ID # is [id number]. Ref # [id number]. Awaiting fax.

## 2011-05-04 NOTE — Telephone Encounter (Signed)
Form received and placed on CDY cart for signature. Will forward msg to Florentina Addison so she can track res[onse.

## 2011-05-05 NOTE — Telephone Encounter (Signed)
Faxed back to insurance company and paperwork is in Triage waiting area.

## 2011-05-10 NOTE — Telephone Encounter (Signed)
Approval 05-04-2011 through 01-27-2014; form sent to scan in EPIC

## 2011-05-12 NOTE — Progress Notes (Signed)
Quick Note:  Pt aware of results. ______ 

## 2011-05-13 ENCOUNTER — Telehealth: Payer: Self-pay | Admitting: Internal Medicine

## 2011-05-13 MED ORDER — AZITHROMYCIN 250 MG PO TABS: ORAL_TABLET | ORAL | Status: AC

## 2011-05-13 NOTE — Telephone Encounter (Signed)
Per CY-okay to give Zpak #1 take as directed no refills and consider Mucinex DM OTC. I left message on patients cell number of this and spoke with [facility] as well.

## 2011-12-23 ENCOUNTER — Ambulatory Visit (INDEPENDENT_AMBULATORY_CARE_PROVIDER_SITE_OTHER): Payer: BC Managed Care – PPO | Admitting: Internal Medicine

## 2011-12-23 ENCOUNTER — Encounter: Payer: Self-pay | Admitting: Internal Medicine

## 2011-12-23 VITALS — BP 132/80 | HR 49 | Ht 71.0 in | Wt 201.8 lb

## 2011-12-23 DIAGNOSIS — G4733 Obstructive sleep apnea (adult) (pediatric): Secondary | ICD-10-CM

## 2011-12-23 MED ORDER — ESOMEPRAZOLE MAGNESIUM 40 MG PO CPDR: 40.0000 mg | DELAYED_RELEASE_CAPSULE | Freq: Every day | ORAL | Status: DC

## 2011-12-23 NOTE — Progress Notes (Signed)
Patient ID: Matthew Richardson, male    DOB: [birth year], 51 y.o.   MRN: [id number]  HPI 04/29/11- 51 year old male former smoker who was previous patient at the old practice 7 years ago, coming now to establish for renewal of medication. He is a runner and notes mild shortness of breath and chest tightness mainly associated with running on a seasonal basis, particularly in the fall. He also blames ragweed and mold exposures. Remote allergy evaluation by Dr Beaulah Dinning. Usually Singulair has been sufficient. He has not needed a metered inhaler at all. He rarely notices actual wheeze or cough, nasal congestion or sinus drainage. Last prednisone was years ago. He had smoked up to a pack and a half a day of cigarettes, ending in 2003. No significant family history of asthma or allergy.  12/23/11-NEW PROBLEM- concerned about sleep apnea. Wife describes recurrent apneas during sleep with very loud snoring audible through closed doors from separate room. His family also ordered. He wakes frequently through the night. He works as 60-70 hour work week and expects to be tired at night with short sleep latency. Bedtime 9 to 10 PM, sleep latency 5 minutes, wakes 2-3 times for bathroom before up at 5 AM. Sleepy if he sits. Little caffeine. Denies nasal stuffiness. Breathe Right strips made him "worse". No ENT surgery. Mild intermittent asthma and allergic rhinitis. GERD for which he asks Nexium.  Review of Systems-see HPI Constitutional:   No-   weight loss, night sweats, fevers, chills,+ fatigue, lassitude. HEENT:   No-  headaches, difficulty swallowing, tooth/dental problems, sore throat,       No-  sneezing, itching, ear ache, +nasal congestion, post nasal drip,  CV:  No-   chest pain, orthopnea, PND, swelling in lower extremities, anasarca, dizziness, palpitations Resp: No-   shortness of breath with exertion or at rest.              No-   productive cough,  No non-productive cough,  No-  coughing up of blood.        No-   change in color of mucus.  No- wheezing.   Skin: No-   rash or lesions. GI:  No-   heartburn, indigestion, abdominal pain, nausea, vomiting,  GU:  MS:  No-   joint pain or swelling.   Neuro- nothing unusual Psych:  No- change in mood or affect. No depression or anxiety.  No memory loss.     Objective:   Physical Exam General- Alert, Oriented, Affect-appropriate, Distress- none acute; very fit appearing-note sinus bradycardia. Trim Skin- rash-none, lesions- none, excoriation- none. Tan. Lymphadenopathy- none Head- atraumatic            Eyes- Gross vision intact, PERRLA, conjunctivae clear secretions            Ears- Hearing, canals normal            Nose- Clear, no-Septal dev, mucus, polyps, erosion, perforation             Throat- Mallampati III-IV , mucosa clear , drainage- none, tonsils- atrophic Neck- flexible , trachea midline, no stridor , thyroid nl, carotid no bruit Chest - symmetrical excursion , unlabored           Heart/CV- RRR , no murmur , no gallop  , no rub, nl s1 s2                           - JVD- none , edema-  none, stasis changes- none, varices- none           Lung- transient crackles right base posteriorly, cleared with second deep breath., wheeze- none, cough- none , dullness-none, rub- none           Chest wall-  Abd-  Br/ Gen/ Rectal- Not done, not indicated Extrem- cyanosis- none, clubbing, none, atrophy- none, strength- nl Neuro- grossly intact to observation

## 2011-12-23 NOTE — Patient Instructions (Signed)
Order- "Matthew Richardson" unattended home Sleep Study   Dx OSA

## 2011-12-25 DIAGNOSIS — G4733 Obstructive sleep apnea (adult) (pediatric): Secondary | ICD-10-CM | POA: Insufficient documentation

## 2011-12-25 NOTE — Assessment & Plan Note (Signed)
Probable obstructive sleep apnea. He is a good candidate for an unattended home study, which we can schedule.

## 2012-02-03 ENCOUNTER — Ambulatory Visit: Payer: BC Managed Care – PPO | Admitting: Internal Medicine

## 2012-02-13 ENCOUNTER — Encounter: Payer: Self-pay | Admitting: Internal Medicine

## 2012-02-13 ENCOUNTER — Ambulatory Visit (INDEPENDENT_AMBULATORY_CARE_PROVIDER_SITE_OTHER): Payer: BC Managed Care – PPO | Admitting: Internal Medicine

## 2012-02-13 ENCOUNTER — Ambulatory Visit: Payer: BC Managed Care – PPO | Admitting: Internal Medicine

## 2012-02-13 VITALS — BP 130/80 | HR 51 | Temp 99.9°F | Resp 16 | Wt 210.0 lb

## 2012-02-13 DIAGNOSIS — N410 Acute prostatitis: Secondary | ICD-10-CM

## 2012-02-13 DIAGNOSIS — N3090 Cystitis, unspecified without hematuria: Secondary | ICD-10-CM

## 2012-02-13 LAB — POCT URINALYSIS DIPSTICK
Bilirubin, UA: NEGATIVE
Leukocytes, UA: NEGATIVE
Nitrite, UA: NEGATIVE
Protein, UA: NEGATIVE
pH, UA: 6.5

## 2012-02-13 MED ORDER — CIPROFLOXACIN HCL 500 MG PO TABS: 500.0000 mg | ORAL_TABLET | Freq: Two times a day (BID) | ORAL | Status: AC

## 2012-02-13 MED ORDER — CIPROFLOXACIN HCL 500 MG PO TABS: 500.0000 mg | ORAL_TABLET | Freq: Two times a day (BID) | ORAL | Status: DC

## 2012-02-13 NOTE — Progress Notes (Signed)
Subjective:    Patient ID: Matthew Richardson, male    DOB: 12/15/1960, 51 y.o.   MRN: [id number]  Dysuria  This is a new problem. The current episode started yesterday. The problem occurs every urination. The problem has been unchanged. The quality of the pain is described as burning. The pain is at a severity of 1/10. The patient is experiencing no pain. There has been no fever. The fever has been present for less than 1 day. He is sexually active. There is no history of pyelonephritis. Associated symptoms include frequency and hesitancy. Pertinent negatives include no chills, discharge, flank pain, hematuria, nausea, possible pregnancy, sweats, urgency or vomiting. He has tried nothing for the symptoms. His past medical history is significant for kidney stones.      Review of Systems  Constitutional: Negative for fever, chills, diaphoresis, appetite change, fatigue and unexpected weight change.  HENT: Negative.   Eyes: Negative.   Respiratory: Negative for cough, chest tightness, shortness of breath, wheezing and stridor.   Cardiovascular: Negative for chest pain, palpitations and leg swelling.  Gastrointestinal: Negative for nausea, vomiting, abdominal pain, diarrhea, constipation, blood in stool and anal bleeding.  Genitourinary: Positive for dysuria, hesitancy and frequency. Negative for urgency, hematuria, flank pain, decreased urine volume, discharge, penile swelling, scrotal swelling, enuresis, difficulty urinating, genital sores, penile pain and testicular pain.  Musculoskeletal: Negative for myalgias, back pain, joint swelling, arthralgias and gait problem.  Skin: Negative for color change, pallor, rash and wound.  Neurological: Negative.   Hematological: Negative for adenopathy. Does not bruise/bleed easily.  Psychiatric/Behavioral: Negative.        Objective:   Physical Exam  Vitals reviewed. Constitutional: He is oriented to person, place, and time. He appears well-developed and  well-nourished. No distress.  HENT:  Head: Normocephalic and atraumatic.  Mouth/Throat: Oropharynx is clear and moist. No oropharyngeal exudate.  Eyes: Conjunctivae are normal. Right eye exhibits no discharge. Left eye exhibits no discharge. No scleral icterus.  Neck: Normal range of motion. Neck supple. No JVD present. No tracheal deviation present. No thyromegaly present.  Cardiovascular: Normal rate, regular rhythm, normal heart sounds and intact distal pulses.  Exam reveals no gallop and no friction rub.   No murmur heard. Pulmonary/Chest: Effort normal and breath sounds normal. No stridor. No respiratory distress. He has no wheezes. He has no rales. He exhibits no tenderness.  Abdominal: Soft. Bowel sounds are normal. He exhibits no distension and no mass. There is no hepatosplenomegaly. There is no tenderness. There is no rebound, no guarding and no CVA tenderness. Hernia confirmed negative in the right inguinal area and confirmed negative in the left inguinal area.  Genitourinary: Rectum normal, testes normal and penis normal. Rectal exam shows no external hemorrhoid, no internal hemorrhoid, no fissure, no mass, no tenderness and anal tone normal. Guaiac negative stool. Prostate is enlarged (left lobe slightly enlarged) and tender (boggy). Right testis shows no mass, no swelling and no tenderness. Right testis is descended. Left testis shows no mass, no swelling and no tenderness. Left testis is descended. Circumcised. No penile tenderness. No discharge found.  Musculoskeletal: Normal range of motion. He exhibits no edema and no tenderness.  Lymphadenopathy:    He has no cervical adenopathy.       Right: No inguinal adenopathy present.       Left: No inguinal adenopathy present.  Neurological: He is oriented to person, place, and time.  Skin: Skin is warm and dry. No rash noted. He is not diaphoretic. No  erythema. No pallor.  Psychiatric: He has a normal mood and affect. His behavior is  normal. Judgment and thought content normal.     Lab Results  Component Value Date   WBC 10.7* 03/22/2010   HGB 14.5 03/22/2010   HCT 41.1 03/22/2010   PLT 325.0 03/22/2010   GLUCOSE 116* 03/22/2010   ALT 19 03/22/2010   AST 18 03/22/2010   NA 141 03/22/2010   K 4.0 03/22/2010   CL 105 03/22/2010   CREATININE 0.8 03/22/2010   BUN 16 03/22/2010   CO2 28 03/22/2010   INR 1.1 RATIO* 07/18/2008       Assessment & Plan:

## 2012-02-13 NOTE — Assessment & Plan Note (Signed)
Start cipro and he was given pt ed material as well

## 2012-02-13 NOTE — Patient Instructions (Signed)
Prostatitis Prostatitis is an inflammation (the body's way of reacting to injury and/or infection) of the prostate gland. The prostate gland is a male organ. The gland is about the size and shape of a walnut. The prostate is located just below the bladder. It produces semen, which is a fluid that helps nourish and transport sperm. Prostatitis is the most common urinary tract problem in men younger than age 50. There are 4 categories of prostatitis:  I - Acute bacterial prostatitis.   II - Chronic bacterial prostatitis.   III - Chronic prostatitis and chronic pelvic pain syndrome (CPPS).   Inflammatory.   Non inflammatory.   IV - Asymptomatic inflammatory prostatitis.  Acute and chronic bacterial prostatitis are problems with bacterial infections of the prostate. "Acute" infection is usually a one-time problem. "Chronic" bacterial prostatitis is a condition with recurrent infection. It is usually caused by the same germ(bacteria). CPPS has symptoms similar to prostate infection. However, no infection is actually found. This condition can cause problems of ongoing pain. Currently, it cannot be cured. Treatments are available and aimed at symptom control.  Asymptomatic inflammatory prostatitis has no symptoms. It is a condition where infection-fighting cells are found by chance in the urine. The diagnosis is made most often during an exam for other conditions. Other conditions could be infertility or a high level of PSA (prostate-specific antigen) in the blood. SYMPTOMS  Symptoms can vary depending upon the type of prostatitis that exists. There can also be overlap in symptoms. This can make diagnosis difficult. Symptoms: For Acute bacterial prostatitis  Painful urination.   Fever or chills.   Muscle or joint pains.   Low back pain.   Low abdominal pain.   Inability to empty bladder completely.   Sudden urges to urinate.   Frequent urination during the day.   Difficulty starting  urine stream.   Need to urinate several times at night (nocturia).   Weak urine stream.   Urethral (tube that carries urine from the bladder out of the body) discharge and dribbling after urination.  For Chronic bacterial prostatitis  Rectal pain.   Pain in the testicles, penis, or tip of the penis.   Pain in the space between the anus and scrotum (perineum).   Low back pain.   Low abdominal pain.   Problems with sexual function.   Painful ejaculation.   Bloody semen.   Inability to empty bladder completely.   Painful urination.   Sudden urges to urinate.   Frequent urination during the day.   Difficulty starting urine stream.   Need to urinate several times at night (nocturia).   Weak urine stream.   Dribbling after urination.   Urethral discharge.  For Chronic prostatitis and chronic pelvic pain syndrome (CPPS) Symptoms are the same as those for chronic bacterial prostatitis. Problems with sexual function are often the reason for seeking care. This important problem should be discussed with your caregiver. For Asymptomatic inflammatory prostatitis As noted above, there are no symptoms with this condition. DIAGNOSIS   Your caregiver may perform a rectal exam. This exam is to determine if the prostate is swollen and tender.   Sometimes blood work is performed. This is done to see if your white blood cell count is elevated. The Prostate Specific Antigen (PSA) is also measured. PSA is a blood test that can help detect early prostate cancer.   A urinalysis is done to find out what type of infection is present if this is a suspected cause. An   additional urinalysis may be done after a digital rectal exam. This is to see if white blood cells are pushed out of the prostate and into the urine. A low-grade infection of the prostate may not be found on the first urinalysis.  In more difficult cases, your caregiver may advise other tests. Tests could include:  Urodynamics  -- Tests the function of the bladder and the organs involved in triggering and controlling normal urination.   Urine flow rate.   Cystoscopy -- In this procedure, a thin, telescope-like tube with a light and tiny camera attached (cystoscope) is inserted into the bladder through the urethra. This allows the caregiver to see the inside of the urethra and bladder.   Electromyography -- This procedure tests how the muscles and nerves of the bladder work. It is focused on the muscles that control the anus and pelvic floor. These are the muscles between the anus and scrotum.  In people who show no signs of infection, certain uncommon infections might be causing constant or recurrent symptoms. These uncommon infections are difficult to detect. More work in medicine may help find solutions to these problems. TREATMENT  Antibiotics are used to treat infections caused by germs. If the infection is not treated and becomes long lasting (chronic), it may become a lower grade infection with minor, continual problems. Without treatment, the prostate may develop a boil or furuncle (abscess). This may require surgical treatment. For those with chronic prostatitis and CPPS, it is important to work closely with your primary caregiver and urologist. For some, the medicines that are used to treat a non-cancerous, enlarged prostate (benign prostatic hypertrophy) may be helpful. Referrals to specialists other than urologists may be necessary. In rare cases when all treatments have been inadequate for pain control, an operation to remove the prostate may be recommended. This is very rare and before this is considered thorough discussion with your urologist is highly recommended.  In cases of secondary to chronic non-bacterial prostatitis, a good relationship with your urologist or primary caregiver is essential because it is often a recurrent prolonged condition that requires a good understanding of the causes and a commitment  to therapy aimed at controlling your symptoms. HOME CARE INSTRUCTIONS   Hot sitz baths for 20 minutes, 4 times per day, may help relieve pain.   Non-prescription pain killers may be used as your caregiver recommends if you have no allergies to them. Some illnesses or conditions prevent use of non-prescription drugs. If unsure, check with your caregiver. Take all medications as directed. Take the antibiotics for the prescribed length of time, even if you are feeling better.  SEEK MEDICAL CARE IF:   You have any worsening of the symptoms that originally brought you to your caregiver.   You have an oral temperature above 102 F (38.9 C).   You experience any side effects from medications prescribed.  SEEK IMMEDIATE MEDICAL CARE IF:   You have an oral temperature above 102 F (38.9 C), not controlled by medicine.   You have pain not relieved with medications.   You develop nausea, vomiting, lightheadedness, or have a fainting episode.   You are unable to urinate.   You pass bloody urine or clots.  Document Released: 08/05/2000 Document Revised: 07/28/2011 Document Reviewed: 07/11/2011 [employer] Patient Information 2012 [employer]. 

## 2012-02-14 ENCOUNTER — Encounter (HOSPITAL_COMMUNITY): Payer: Self-pay | Admitting: *Deleted

## 2012-02-14 ENCOUNTER — Emergency Department (HOSPITAL_COMMUNITY)
Admission: EM | Admit: 2012-02-14 | Discharge: 2012-02-14 | Disposition: A | Discharge status: Home / Self Care | Payer: BC Managed Care – PPO | Source: Home / Self Care | Attending: Emergency Medicine | Admitting: Emergency Medicine

## 2012-02-14 ENCOUNTER — Telehealth: Payer: Self-pay | Admitting: Internal Medicine

## 2012-02-14 ENCOUNTER — Emergency Department (INDEPENDENT_AMBULATORY_CARE_PROVIDER_SITE_OTHER): Payer: BC Managed Care – PPO

## 2012-02-14 DIAGNOSIS — N200 Calculus of kidney: Secondary | ICD-10-CM

## 2012-02-14 DIAGNOSIS — N419 Inflammatory disease of prostate, unspecified: Secondary | ICD-10-CM

## 2012-02-14 LAB — POCT URINALYSIS DIP (DEVICE)
Glucose, UA: NEGATIVE mg/dL
Ketones, ur: NEGATIVE mg/dL
Leukocytes, UA: NEGATIVE
Protein, ur: NEGATIVE mg/dL
Urobilinogen, UA: 0.2 mg/dL (ref 0.0–1.0)

## 2012-02-14 MED ORDER — ONDANSETRON 8 MG PO TBDP: 8.0000 mg | ORAL_TABLET | Freq: Three times a day (TID) | ORAL | Status: DC | PRN

## 2012-02-14 MED ORDER — KETOROLAC TROMETHAMINE 10 MG PO TABS: 10.0000 mg | ORAL_TABLET | Freq: Four times a day (QID) | ORAL | Status: AC | PRN

## 2012-02-14 MED ORDER — OXYCODONE-ACETAMINOPHEN 5-325 MG PO TABS: ORAL_TABLET | ORAL | Status: DC

## 2012-02-14 MED ORDER — TAMSULOSIN HCL 0.4 MG PO CAPS: 0.4000 mg | ORAL_CAPSULE | Freq: Every day | ORAL | Status: DC

## 2012-02-14 NOTE — ED Provider Notes (Signed)
Chief Complaint  Patient presents with  . Flank Pain    History of Present Illness:   Matthew Richardson is a 51 year old male who today around 2:30 PM noted sudden onset of severe, 10 over 10 in intensity pain in the left flank area radiating towards the left groin. This lasted 10-15 minutes then went away completely. He had a second episode about an hour later that was not quite as severe, rated 6/10 in intensity that lasted about 10 minutes. He's not had any further episodes since then. He denies passing any obvious stones or gravel. He has had a two-day history of pain in his lower back, urinary frequency, and diminished stream. He went to see his primary care doctor yesterday. His urinalysis showed a trace of blood in his prostate was boggy and tender. He was diagnosed with prostatitis and was given Cipro for 30 days. He's only taken 3 doses. He's a little bit nauseated but no fever chills. Right now his urinary stream is improved. He does have a history of kidney stones about 10 years ago and saw Dr. Darvin Neighbours for this. He passed a stone spontaneously.  Review of Systems:  Other than noted above, the patient denies any of the following symptoms: General:  No fevers, chills, sweats, aches, or fatigue. GI:  No abdominal pain, back pain, nausea, vomiting, diarrhea, or constipation. GU:  No dysuria, frequency, urgency, hematuria, urethral discharge, penile lesions, penile pain, testicular pain, swelling, or mass, inguinal lymphadenopathy or incontinence.  PMFSH:  Past medical history, family history, social history, meds, and allergies were reviewed.  Physical Exam:   Vital signs:  BP 124/78  Pulse 52  Temp 98.3 F (36.8 C) (Oral)  Resp 18  SpO2 100% Gen:  Alert, oriented, in no distress. Lungs:  Clear to auscultation, no wheezes, rales or rhonchi. Heart:  Regular rhythm, no gallop or murmer. Abdomen:  Flat and soft.  No tenderness to palpation, guarding, or rebound.  No hepato-splenomegaly or mass.   Bowel sounds were normally active.  No hernia. Back:  No CVA tenderness.  Skin:  Clear, warm and dry.  Labs:   Results for orders placed during the hospital encounter of 02/14/12  POCT URINALYSIS DIP (DEVICE)      Component Value Range   Glucose, UA NEGATIVE  NEGATIVE mg/dL   Bilirubin Urine NEGATIVE  NEGATIVE   Ketones, ur NEGATIVE  NEGATIVE mg/dL   Specific Gravity, Urine 1.015  1.005 - 1.030   Hgb urine dipstick SMALL (*) NEGATIVE   pH 6.5  5.0 - 8.0   Protein, ur NEGATIVE  NEGATIVE mg/dL   Urobilinogen, UA 0.2  0.0 - 1.0 mg/dL   Nitrite NEGATIVE  NEGATIVE   Leukocytes, UA NEGATIVE  NEGATIVE    Dg Abd 1 View  02/14/2012  *RADIOLOGY REPORT*  Clinical Data: 51 year old male with right-sided pain, right lower quadrant.  History of kidney stones.  ABDOMEN - 1 VIEW  Comparison: Chest radiograph 04/29/2011.  Findings: No definite urologic calculus identified.  Tiny pelvic phlebolith on the left. Nonobstructed bowel gas pattern.  Abdominal and pelvic visceral contours are within normal limits. No acute osseous abnormality identified.  IMPRESSION: No definite urologic calculus. Bowel gas pattern is nonobstructed.  Original Report Authenticated By: Harley Hallmark, M.D.   Assessment: The primary encounter diagnosis was Kidney stone. A diagnosis of Prostatitis was also pertinent to this visit.   Plan:   1.  The following meds were prescribed:   New Prescriptions   KETOROLAC (TORADOL) 10  MG TABLET    Take 1 tablet (10 mg total) by mouth every 6 (six) hours as needed for pain.   ONDANSETRON (ZOFRAN ODT) 8 MG DISINTEGRATING TABLET    Take 1 tablet (8 mg total) by mouth every 8 (eight) hours as needed for nausea.   OXYCODONE-ACETAMINOPHEN (PERCOCET) 5-325 MG PER TABLET    1 to 2 tablets every 6 hours as needed for pain.   TAMSULOSIN HCL (FLOMAX) 0.4 MG CAPS    Take 1 capsule (0.4 mg total) by mouth daily.   2.  The patient was instructed in symptomatic care and handouts were given. 3.  The  patient was told to return if becoming worse in any way, if no better in 3 or 4 days, and given some red flag symptoms that would indicate earlier return. Suggested low oxalate diet and increasing fluids. He may have already passed a stone and I gave him written prescriptions for all the meds. He may not even need to get them filled. If no better in 2 days, suggested followup with urologist. He was given the name of Dr. Tama Gander.     Reuben Likes, MD 02/14/12 2051

## 2012-02-14 NOTE — ED Notes (Signed)
Pt  Reports  r  Flank  Pain    Seen  Yesterday  By  pcp  For  Prostate   Infection      Reports  Today    Pain  Worse  And had  Nausea  As  Well had  Low  Grade  Temp  yest     -  Pain better  At this  Time

## 2012-02-14 NOTE — ED Notes (Signed)
Pt  Given a  Urine  Strainer

## 2012-02-14 NOTE — Discharge Instructions (Signed)
Oxalate Restricted Diet A low-oxalate diet consists of foods that are low in a naturally occurring compound called oxalate that is found in plants.  INDICATIONS FOR USE Your caregiver may ask you to follow a low-oxalate diet in order to reduce certain types of kidney stones.  GUIDELINES  Avoid high-oxalate foods listed below:  Grains: High-fiber or bran cereal, whole-wheat bread, grits, barley, buckwheat, graham crackers, amaranth, pretzels, and fruitcake.   Vegetables: Dried beans, wax beans, [place] fries, sweet potatoes, chives, dark leafy greens, eggplant, leaks, okra, parsley, rutabaga, tomato paste, watercress, and escarole.   Fruit: Dried apricots, red currants, figs, kiwi, plums, and rhubarb.   Meat and Meat Substitutes: All nuts and nut butters, sesame seeds, and tahini paste. Soybeans and foods made from soy (soyburger, miso).   Milk: Chocolate milk and soymilk.   Fats and Oils: None to avoid.   Condiments/Miscellaneous: Chocolate, carob, marmalade, poppy seeds, instant iced tea, and juice from high-oxalate fruits.  Document Released: 12/03/2010 Document Revised: 07/28/2011 Document Reviewed: 12/03/2010 [facility] Patient Information 2012 [place], [place].Kidney Stones Kidney stones (ureteral lithiasis) are deposits that form inside your kidneys. The intense pain is caused by the stone moving through the urinary tract. When the stone moves, the ureter goes into spasm around the stone. The stone is usually passed in the urine.  CAUSES  A disorder that makes certain neck glands produce too much parathyroid hormone (primary hyperparathyroidism).  A buildup of uric acid crystals.  Narrowing (stricture) of the ureter.  A kidney obstruction present at birth (congenital obstruction).  Previous surgery on the kidney or ureters.  Numerous kidney infections.  SYMPTOMS  Feeling sick to your stomach (nauseous).  Throwing up (vomiting).  Blood in the urine (hematuria).  Pain that usually  spreads (radiates) to the groin.  Frequency or urgency of urination.  DIAGNOSIS  Taking a history and physical exam.  Blood or urine tests.  Computerized X-ray scan (CT scan).  Occasionally, an examination of the inside of the urinary bladder (cystoscopy) is performed.  TREATMENT  Observation.  Increasing your fluid intake.  Surgery may be needed if you have severe pain or persistent obstruction.  The size, location, and chemical composition are all important variables that will determine the proper choice of action for you. Talk to your caregiver to better understand your situation so that you will minimize the risk of injury to yourself and your kidney.  HOME CARE INSTRUCTIONS  Drink enough water and fluids to keep your urine clear or pale yellow.  Strain all urine through the provided strainer. Keep all particulate matter and stones for your caregiver to see. The stone causing the pain may be as small as a grain of salt. It is very important to use the strainer each and every time you pass your urine. The collection of your stone will allow your caregiver to analyze it and verify that a stone has actually passed.  Only take over-the-counter or prescription medicines for pain, discomfort, or fever as directed by your caregiver.  Make a follow-up appointment with your caregiver as directed.  Get follow-up X-rays if required. The absence of pain does not always mean that the stone has passed. It may have only stopped moving. If the urine remains completely obstructed, it can cause loss of kidney function or even complete destruction of the kidney. It is your responsibility to make sure X-rays and follow-ups are completed. Ultrasounds of the kidney can show blockages and the status of the kidney. Ultrasounds are not associated with  any radiation and can be performed easily in a matter of minutes.  SEEK IMMEDIATE MEDICAL CARE IF:  Pain cannot be controlled with the prescribed medicine.  You have a  fever.  The severity or intensity of pain increases over 18 hours and is not relieved by pain medicine.  You develop a new onset of abdominal pain.  You feel faint or pass out.  MAKE SURE YOU:  Understand these instructions.  Will watch your condition.  Will get help right away if you are not doing well or get worse.  Document Released: 08/08/2005 Document Revised: 07/28/2011 Document Reviewed: 12/04/2009 [facility] Patient Information 2012 [place], [place].

## 2012-02-14 NOTE — Telephone Encounter (Signed)
Pt is requesting medicine for nausea and for back pain.

## 2012-02-14 NOTE — Telephone Encounter (Signed)
Caller: Harun/Patient; PCP: Sanda Linger; CB#: [phone];  Call regarding Severe Abd Pain; Pain is constant at 5/10 with intermittent "attacks" of severe pain rated 9/10 causing him to nearly pass out.   Diagnosed with Prostatitis 02/13/12.  Denies flank or back pain. No hematuria.  Office is now closed. Advised to see [facility] now for pain described as deep per Abdominal Pain Guideline.

## 2012-02-15 ENCOUNTER — Telehealth: Payer: Self-pay | Admitting: Internal Medicine

## 2012-02-15 ENCOUNTER — Ambulatory Visit (INDEPENDENT_AMBULATORY_CARE_PROVIDER_SITE_OTHER): Payer: BC Managed Care – PPO | Admitting: Internal Medicine

## 2012-02-15 ENCOUNTER — Encounter: Payer: Self-pay | Admitting: Internal Medicine

## 2012-02-15 ENCOUNTER — Ambulatory Visit (INDEPENDENT_AMBULATORY_CARE_PROVIDER_SITE_OTHER)
Admission: RE | Admit: 2012-02-15 | Discharge: 2012-02-15 | Disposition: A | Discharge status: Home / Self Care | Payer: BC Managed Care – PPO | Source: Ambulatory Visit | Attending: Internal Medicine | Admitting: Internal Medicine

## 2012-02-15 DIAGNOSIS — N200 Calculus of kidney: Secondary | ICD-10-CM

## 2012-02-15 DIAGNOSIS — Z87442 Personal history of urinary calculi: Secondary | ICD-10-CM

## 2012-02-15 DIAGNOSIS — R10A0 Flank pain, unspecified side: Secondary | ICD-10-CM

## 2012-02-15 DIAGNOSIS — R319 Hematuria, unspecified: Secondary | ICD-10-CM

## 2012-02-15 DIAGNOSIS — R109 Unspecified abdominal pain: Secondary | ICD-10-CM | POA: Insufficient documentation

## 2012-02-15 DIAGNOSIS — R52 Pain, unspecified: Secondary | ICD-10-CM

## 2012-02-15 NOTE — Telephone Encounter (Signed)
Wife, Bjorn Loser is aware.

## 2012-02-15 NOTE — Progress Notes (Signed)
  Subjective:    Patient ID: Matthew Richardson, male    DOB: [birth year], 51 y.o.   MRN: [id number]  HPI   referred to urology Review of Systems     Objective:   Physical Exam        Assessment & Plan:

## 2012-02-15 NOTE — Telephone Encounter (Signed)
Pt went to [facility] last night.  He had an x-ray for Kidney stones.  He is not better.  UC suggested an CT of ABD with contrast if not better.  Should he be seen here or have the CT?

## 2012-02-15 NOTE — Telephone Encounter (Signed)
Ct ordered

## 2012-02-15 NOTE — Telephone Encounter (Signed)
[facility] Triage Call Report Triage Record Num: [id number] Operator: Geanie Berlin Patient Name: Matthew Richardson Call Date & Time: 02/14/2012 4:57:54PM Patient Phone: [phone] PCP: Sanda Linger Patient Gender: Male PCP Fax : Patient DOB: September 03, 1960 Practice Name: [facility] Reason for Call: Caller: [name]/Patient; PCP: Sanda Linger; CB#: [phone]; Call regarding Severe Abd Pain; Pain is constant at 5/10 with intermittent "attacks" of severe pain rated 9/10 causing him to nearly pass out. Diagnosed with Prostatitis 02/13/12. Denies flank or back pain. No hematuria. Office is now closed. Advised to see [facility] now for pain described as deep per Abdominal Pain Guideline. Protocol(s) Used: Abdominal Pain Recommended Outcome per Protocol: See ED Immediately Reason for Outcome: Pain described as deep, boring, or tearing Care Advice: ~ Another adult should drive. ~ Do not give the patient anything to eat or drink. ~ Do not push on abdomen. ~ IMMEDIATE ACTION Write down provider's name. List or place the following in a bag for transport with the patient: current prescription and/or nonprescription medications; alternative treatments, therapies and medications; and street drugs. ~ 02/14/2012 5:11:09PM Page 1 of 1 [email or web address]

## 2012-02-16 ENCOUNTER — Other Ambulatory Visit: Payer: BC Managed Care – PPO

## 2012-02-17 ENCOUNTER — Telehealth: Payer: Self-pay | Admitting: Internal Medicine

## 2012-02-17 MED ORDER — ONDANSETRON 8 MG PO TBDP: 8.0000 mg | ORAL_TABLET | Freq: Three times a day (TID) | ORAL | Status: AC | PRN

## 2012-02-17 MED ORDER — TAMSULOSIN HCL 0.4 MG PO CAPS: 0.4000 mg | ORAL_CAPSULE | Freq: Every day | ORAL | Status: DC

## 2012-02-17 MED ORDER — OXYCODONE-ACETAMINOPHEN 5-325 MG PO TABS: ORAL_TABLET | ORAL | Status: AC

## 2012-02-17 NOTE — Telephone Encounter (Signed)
He has to pick up the percocet rx

## 2012-02-17 NOTE — Telephone Encounter (Signed)
PT IS NEEDING REFILLS ON THE NAUSEA AND PAIN MEDICINE AND FLOMAX.  HE IS ALMOST OUT.  HE HAS NOT PASSED THE KIDNEY STONE YET.  [facility].    PHONE NUMBER IS [phone]

## 2012-03-16 ENCOUNTER — Ambulatory Visit (INDEPENDENT_AMBULATORY_CARE_PROVIDER_SITE_OTHER): Payer: BC Managed Care – PPO | Admitting: Internal Medicine

## 2012-03-16 ENCOUNTER — Encounter: Payer: Self-pay | Admitting: Internal Medicine

## 2012-03-16 ENCOUNTER — Other Ambulatory Visit (INDEPENDENT_AMBULATORY_CARE_PROVIDER_SITE_OTHER): Payer: BC Managed Care – PPO

## 2012-03-16 VITALS — BP 120/84 | HR 50 | Temp 98.8°F | Resp 16 | Wt 200.0 lb

## 2012-03-16 DIAGNOSIS — N410 Acute prostatitis: Secondary | ICD-10-CM

## 2012-03-16 DIAGNOSIS — B1910 Unspecified viral hepatitis B without hepatic coma: Secondary | ICD-10-CM

## 2012-03-16 DIAGNOSIS — R7309 Other abnormal glucose: Secondary | ICD-10-CM | POA: Insufficient documentation

## 2012-03-16 DIAGNOSIS — J45990 Exercise induced bronchospasm: Secondary | ICD-10-CM

## 2012-03-16 DIAGNOSIS — N200 Calculus of kidney: Secondary | ICD-10-CM

## 2012-03-16 LAB — URINALYSIS, ROUTINE W REFLEX MICROSCOPIC
Hgb urine dipstick: NEGATIVE
Nitrite: NEGATIVE
Total Protein, Urine: NEGATIVE

## 2012-03-16 LAB — LDL CHOLESTEROL, DIRECT: Direct LDL: 109.4 mg/dL

## 2012-03-16 LAB — CBC WITH DIFFERENTIAL/PLATELET
Eosinophils Relative: 2.4 % (ref 0.0–5.0)
Lymphocytes Relative: 28.1 % (ref 12.0–46.0)
Monocytes Relative: 4.9 % (ref 3.0–12.0)
Neutrophils Relative %: 64.2 % (ref 43.0–77.0)
Platelets: 196 10*3/uL (ref 150.0–400.0)
RBC: 4.61 Mil/uL (ref 4.22–5.81)
WBC: 4.8 10*3/uL (ref 4.5–10.5)

## 2012-03-16 LAB — COMPREHENSIVE METABOLIC PANEL
Albumin: 4.2 g/dL (ref 3.5–5.2)
Alkaline Phosphatase: 41 U/L (ref 39–117)
CO2: 28 mEq/L (ref 19–32)
Calcium: 9.1 mg/dL (ref 8.4–10.5)
Chloride: 104 mEq/L (ref 96–112)
GFR: 76.98 mL/min (ref >60.00)
Glucose, Bld: 116 mg/dL — ABNORMAL HIGH (ref 70–99)
Potassium: 4.7 mEq/L (ref 3.5–5.1)
Sodium: 140 mEq/L (ref 135–145)
Total Protein: 7.2 g/dL (ref 6.0–8.3)

## 2012-03-16 LAB — HEMOGLOBIN A1C: Hgb A1c MFr Bld: 5.2 % (ref 4.6–6.5)

## 2012-03-16 LAB — LIPID PANEL: VLDL: 9.4 mg/dL (ref 0.0–40.0)

## 2012-03-16 NOTE — Assessment & Plan Note (Signed)
For a UA and CMP today

## 2012-03-16 NOTE — Assessment & Plan Note (Signed)
His symptoms have resolved, I will recheck his UA today

## 2012-03-16 NOTE — Patient Instructions (Addendum)

## 2012-03-16 NOTE — Assessment & Plan Note (Signed)
CMP and Hep B ag today

## 2012-03-16 NOTE — Progress Notes (Signed)
  Subjective:    Patient ID: Matthew Richardson, male    DOB: August 15, 1961, 51 y.o.   MRN: [id number]  Urinary Tract Infection  This is a recurrent problem. The current episode started more than 1 month ago. The problem has been resolved. The pain is at a severity of 0/10. The patient is experiencing no pain. There has been no fever. He is sexually active. There is no history of pyelonephritis. Pertinent negatives include no chills, discharge, flank pain, frequency, hematuria, hesitancy, nausea, sweats, urgency or vomiting. He has tried antibiotics (cipro) for the symptoms. The treatment provided significant relief. His past medical history is significant for kidney stones.      Review of Systems  Constitutional: Negative for fever, chills, diaphoresis, activity change, appetite change, fatigue and unexpected weight change.  HENT: Negative.   Eyes: Negative.   Respiratory: Negative for cough, chest tightness, shortness of breath, wheezing and stridor.   Cardiovascular: Negative for chest pain, palpitations and leg swelling.  Gastrointestinal: Negative for nausea, vomiting, abdominal pain, diarrhea, constipation, blood in stool and abdominal distention.  Genitourinary: Negative for dysuria, hesitancy, urgency, frequency, hematuria, flank pain, decreased urine volume, discharge, penile swelling, scrotal swelling, enuresis, difficulty urinating, genital sores, penile pain and testicular pain.  Musculoskeletal: Negative.   Skin: Negative for color change, pallor, rash and wound.  Neurological: Negative.   Hematological: Negative for adenopathy. Does not bruise/bleed easily.  Psychiatric/Behavioral: Negative.        Objective:   Physical Exam  Vitals reviewed. Constitutional: He is oriented to person, place, and time. He appears well-developed and well-nourished. No distress.  HENT:  Head: Normocephalic and atraumatic.  Mouth/Throat: Oropharynx is clear and moist. No oropharyngeal exudate.  Eyes:  Conjunctivae are normal. Right eye exhibits no discharge. Left eye exhibits no discharge. No scleral icterus.  Neck: Normal range of motion. Neck supple. No JVD present. No tracheal deviation present. No thyromegaly present.  Cardiovascular: Normal rate, regular rhythm, normal heart sounds and intact distal pulses.  Exam reveals no gallop and no friction rub.   No murmur heard. Pulmonary/Chest: Effort normal and breath sounds normal. No stridor. No respiratory distress. He has no wheezes. He has no rales. He exhibits no tenderness.  Abdominal: Soft. Bowel sounds are normal. He exhibits no distension and no mass. There is no tenderness. There is no rebound and no guarding.  Musculoskeletal: Normal range of motion. He exhibits no edema and no tenderness.  Lymphadenopathy:    He has no cervical adenopathy.  Neurological: He is oriented to person, place, and time.  Skin: Skin is warm and dry. No rash noted. He is not diaphoretic. No erythema. No pallor.  Psychiatric: He has a normal mood and affect. His behavior is normal. Judgment and thought content normal.      Lab Results  Component Value Date   WBC 10.7* 03/22/2010   HGB 14.5 03/22/2010   HCT 41.1 03/22/2010   PLT 325.0 03/22/2010   GLUCOSE 116* 03/22/2010   ALT 19 03/22/2010   AST 18 03/22/2010   NA 141 03/22/2010   K 4.0 03/22/2010   CL 105 03/22/2010   CREATININE 0.8 03/22/2010   BUN 16 03/22/2010   CO2 28 03/22/2010   INR 1.1 RATIO* 07/18/2008      Assessment & Plan:

## 2012-03-16 NOTE — Assessment & Plan Note (Signed)
I will check his A1C to see if he has developed DM II 

## 2012-06-05 ENCOUNTER — Other Ambulatory Visit: Payer: Self-pay | Admitting: Internal Medicine

## 2012-06-05 ENCOUNTER — Telehealth: Payer: Self-pay | Admitting: Internal Medicine

## 2012-06-05 DIAGNOSIS — G4733 Obstructive sleep apnea (adult) (pediatric): Secondary | ICD-10-CM

## 2012-06-05 NOTE — Telephone Encounter (Signed)
From last OV 12/23/11: Patient Instructions     Order- "Matthew Richardson" unattended home Sleep Study Dx [facility]    Pt has not been seen since. Is it okay to place order for this. thanks

## 2012-06-05 NOTE — Telephone Encounter (Signed)
Per CY---ok to order alice unattended home sleep study  Dx [facility].  Order has been sent to [facility].  Nothing further is needed.

## 2012-06-13 ENCOUNTER — Encounter (HOSPITAL_BASED_OUTPATIENT_CLINIC_OR_DEPARTMENT_OTHER): Payer: BC Managed Care – PPO

## 2012-06-14 ENCOUNTER — Ambulatory Visit (HOSPITAL_BASED_OUTPATIENT_CLINIC_OR_DEPARTMENT_OTHER): Payer: BC Managed Care – PPO | Attending: Internal Medicine | Admitting: Radiology

## 2012-06-14 VITALS — Ht 71.0 in | Wt 190.0 lb

## 2012-06-14 DIAGNOSIS — I491 Atrial premature depolarization: Secondary | ICD-10-CM | POA: Insufficient documentation

## 2012-06-14 DIAGNOSIS — G4733 Obstructive sleep apnea (adult) (pediatric): Secondary | ICD-10-CM

## 2012-06-14 DIAGNOSIS — I498 Other specified cardiac arrhythmias: Secondary | ICD-10-CM | POA: Insufficient documentation

## 2012-06-17 NOTE — Procedures (Signed)
Matthew Richardson, LOGALBO                 ACCOUNT NO.:  [id number]  MEDICAL RECORD NO.:  [id number]          PATIENT TYPE:  OUT  LOCATION:  SLEEP CENTER                 FACILITY:  [facility]  PHYSICIAN:  Zohan Shiflet D. Maple Hudson, MD, FCCP, FACPDATE OF BIRTH:  June 03[birth year] 1962  DATE OF STUDY:  06/14/2012                           NOCTURNAL POLYSOMNOGRAM  REFERRING PHYSICIAN:  Charlea Nardo D. Shanyah Gattuso, MD, FCCP, FACP  INDICATION FOR STUDY:  Hypersomnia with sleep apnea.  EPWORTH SLEEPINESS SCORE:  5/24.  BMI 26.5, weight 190 pounds, height 71 inches, neck 15 inches.  MEDICATIONS:  Home medications are charted as "no medications."  SLEEP ARCHITECTURE:  Total sleep time 340 minutes with sleep efficiency 82.4%.  Stage I was 11.5%, stage II 66.8%, stage III absent.  REM 21.8% of total sleep time.  Sleep latency 6.5 minutes, REM latency 79.5 minutes.  Awake after sleep onset 65.5 minutes.  Arousal index 16.4. Bedtime medication:  None.  RESPIRATORY DATA:  Apnea-hypopnea index (AHI) 9.5 per hour.  A total of 54 events was scored including 37 obstructive apneas, 4 central apneas, 13 hypopneas.  Most events were associated with supine sleep position and REM.  REM AHI 25.9 per hour.  There were insufficient numbers of events to meet protocol requirements for split protocol, CPAP titration on this study night.  OXYGEN DATA:  Moderately loud snoring with oxygen desaturation to a nadir of 87% and mean oxygen saturation through the study of 95.3% on room air.  CARDIAC DATA:  Sinus bradycardia with average heart beat 40 per minute (active runner).  ST-segment elevation.  MOVEMENT-PARASOMNIA:  No significant movement disturbance. Bathroom x2.  IMPRESSION-RECOMMENDATION: 1. Mild obstructive sleep apnea/hypopnea syndrome, AHI 9.5 per hour.     Supine and REM related events.  REM AHI 25.9 per hour.  Moderately     loud snoring with oxygen desaturation to a nadir of 87% and mean     oxygen saturation through the study of 95.3%  on room air. 2. There were insufficient numbers of events to meet protocol     requirements for initiation of split protocol, CPAP titration on     this study night. 3. Cardiac monitor recorded sinus bradycardia at about 40 per minute     with ST-segment elevation and occasional PAC.     Nika Yazzie D. Maple Hudson, MD, [facility], FACP Diplomate, American Board of Sleep Medicine    CDY/MEDQ  D:  06/16/2012 12:41:55  T:  06/17/2012 02:33:03  Job:  [id number]

## 2012-06-22 ENCOUNTER — Ambulatory Visit: Payer: BC Managed Care – PPO | Admitting: Internal Medicine

## 2012-06-25 ENCOUNTER — Ambulatory Visit (INDEPENDENT_AMBULATORY_CARE_PROVIDER_SITE_OTHER): Payer: BC Managed Care – PPO | Admitting: Internal Medicine

## 2012-06-25 ENCOUNTER — Encounter: Payer: Self-pay | Admitting: Internal Medicine

## 2012-06-25 VITALS — BP 130/84 | HR 65 | Ht 71.0 in | Wt 199.6 lb

## 2012-06-25 DIAGNOSIS — G4733 Obstructive sleep apnea (adult) (pediatric): Secondary | ICD-10-CM

## 2012-06-25 DIAGNOSIS — R9431 Abnormal electrocardiogram [ECG] [EKG]: Secondary | ICD-10-CM

## 2012-06-25 DIAGNOSIS — Z23 Encounter for immunization: Secondary | ICD-10-CM

## 2012-06-25 NOTE — Patient Instructions (Addendum)
Order- new CPAP autotitrate 5-25 cwp x 7 days for pressure recommendation, mask of choice, humidifier, supplies  Dx OSA  Flu vax

## 2012-06-25 NOTE — Assessment & Plan Note (Signed)
Mild obstructive sleep apnea/hypoxia syndrome. Medical consequences will be pretty low at this point. He does not need to lose weight and he already sleeps on his sides. We have carefully about treatment choices emphasizing CPAP and oral appliances. Answered questions and spoke to his responsibility to drive safely and to maintain good sleep habits. He will discuss this with his wife and consider trying CPAP. I will wait to hear.

## 2012-06-25 NOTE — Progress Notes (Signed)
Patient ID: Matthew Richardson, male    DOB: 1961-07-30, 51 y.o.   MRN: [id number]  HPI 04/29/11- 51 year old male former smoker who was previous patient at the old practice 7 years ago, coming now to establish for renewal of medication. He is a runner and notes mild shortness of breath and chest tightness mainly associated with running on a seasonal basis, particularly in the fall. He also blames ragweed and mold exposures. Remote allergy evaluation by Dr Beaulah Dinning. Usually Singulair has been sufficient. He has not needed a metered inhaler at all. He rarely notices actual wheeze or cough, nasal congestion or sinus drainage. Last prednisone was years ago. He had smoked up to a pack and a half a day of cigarettes, ending in 2003. No significant family history of asthma or allergy.  12/23/11-NEW PROBLEM- concerned about sleep apnea. Wife describes recurrent apneas during sleep with very loud snoring audible through closed doors from separate room. His family also ordered. He wakes frequently through the night. He works as 60-70 hour work week and expects to be tired at night with short sleep latency. Bedtime 9 to 10 PM, sleep latency 5 minutes, wakes 2-3 times for bathroom before up at 5 AM. Sleepy if he sits. Little caffeine. Denies nasal stuffiness. Breathe Right strips made him "worse". No ENT surgery. Mild intermittent asthma and allergic rhinitis. GERD for which he asks Nexium.  06/25/12- 51 year old male former smoker, OSA Review sleep study and EKG with patient. Wife here. NPSG 06/14/12- AHI 9.5 per hour, moderately loud snoring. Weight 190 pounds. He has been under recent stress since loss of job. We discussed sleep hygiene and medical effects of sleep apnea. He continues to exercise regularly on a treadmill. Single-lead EKG recording on his sleep study had an even baseline but suggested abnormal elevation of ST segment. We asked him to come in for EKG and review of his study. He has no history of cardiac  disease and denies chest pain or palpitation.  EKG 06/25/12- nonspecific ST segment changes. Probably normal. EKG will be filed in the system as a baseline.  Review of Systems-see HPI Constitutional:   No-   weight loss, night sweats, fevers, chills,+ fatigue, lassitude. HEENT:   No-  headaches, difficulty swallowing, tooth/dental problems, sore throat,       No-  sneezing, itching, ear ache, +nasal congestion, post nasal drip,  CV:  No-   chest pain, orthopnea, PND, swelling in lower extremities, anasarca, dizziness, palpitations Resp: No-   shortness of breath with exertion or at rest.              No-   productive cough,  No non-productive cough,  No-  coughing up of blood.              No-   change in color of mucus.  No- wheezing.   Skin: No-   rash or lesions. GI:  No-   heartburn, indigestion, abdominal pain, nausea, vomiting,  GU:  MS:  No-   joint pain or swelling.   Neuro- nothing unusual Psych:  No- change in mood or affect. No depression or anxiety.  No memory loss.     Objective:   Physical Exam General- Alert, Oriented, Affect-appropriate, Distress- none acute; very fit appearing-note sinus bradycardia. Trim Skin- rash-none, lesions- none, excoriation- none.  Lymphadenopathy- none Head- atraumatic            Eyes- Gross vision intact, PERRLA, conjunctivae clear secretions  Ears- Hearing, canals normal            Nose- Clear, no-Septal dev, mucus, polyps, erosion, perforation             Throat- Mallampati III-IV , mucosa clear , drainage- none, tonsils- atrophic Neck- flexible , trachea midline, no stridor , thyroid nl, carotid no bruit Chest - symmetrical excursion , unlabored           Heart/CV- RRR , no murmur , no gallop  , no rub, nl s1 s2                           - JVD- none , edema- none, stasis changes- none, varices- none           Lung- clear, wheeze- none, cough- none , dullness-none, rub- none           Chest wall-  Abd-  Br/ Gen/ Rectal- Not  done, not indicated Extrem- cyanosis- none, clubbing, none, atrophy- none, strength- nl Neuro- grossly intact to observation

## 2012-06-29 NOTE — Addendum Note (Signed)
Addended by: Reynaldo Minium C on: 06/29/2012 02:58 PM   Modules accepted: Orders

## 2012-07-02 ENCOUNTER — Other Ambulatory Visit: Payer: Self-pay | Admitting: Internal Medicine

## 2012-07-02 DIAGNOSIS — G4733 Obstructive sleep apnea (adult) (pediatric): Secondary | ICD-10-CM

## 2012-08-20 ENCOUNTER — Telehealth: Payer: Self-pay | Admitting: Internal Medicine

## 2012-08-20 MED ORDER — AZITHROMYCIN 250 MG PO TABS: ORAL_TABLET | ORAL | Status: DC

## 2012-08-20 NOTE — Telephone Encounter (Signed)
Wife reports onset over weekend of acute bronchitis. He had flu vax. Request Z pak.

## 2013-03-13 ENCOUNTER — Encounter: Payer: Self-pay | Admitting: Internal Medicine

## 2013-03-13 ENCOUNTER — Other Ambulatory Visit (INDEPENDENT_AMBULATORY_CARE_PROVIDER_SITE_OTHER): Payer: 59

## 2013-03-13 ENCOUNTER — Ambulatory Visit (INDEPENDENT_AMBULATORY_CARE_PROVIDER_SITE_OTHER): Payer: 59 | Admitting: Internal Medicine

## 2013-03-13 VITALS — BP 120/70 | HR 47 | Temp 97.8°F | Resp 16 | Wt 213.0 lb

## 2013-03-13 DIAGNOSIS — K429 Umbilical hernia without obstruction or gangrene: Secondary | ICD-10-CM | POA: Insufficient documentation

## 2013-03-13 DIAGNOSIS — Z23 Encounter for immunization: Secondary | ICD-10-CM

## 2013-03-13 DIAGNOSIS — R7989 Other specified abnormal findings of blood chemistry: Secondary | ICD-10-CM

## 2013-03-13 DIAGNOSIS — Z0000 Encounter for general adult medical examination without abnormal findings: Secondary | ICD-10-CM

## 2013-03-13 LAB — CBC WITH DIFFERENTIAL/PLATELET
Basophils Absolute: 0 10*3/uL (ref 0.0–0.1)
Basophils Relative: 0.6 % (ref 0.0–3.0)
Eosinophils Absolute: 0.2 10*3/uL (ref 0.0–0.7)
Lymphocytes Relative: 32.9 % (ref 12.0–46.0)
MCHC: 34.8 g/dL (ref 30.0–36.0)
MCV: 90 fl (ref 78.0–100.0)
Monocytes Absolute: 0.3 10*3/uL (ref 0.1–1.0)
Neutrophils Relative %: 56.5 % (ref 43.0–77.0)
Platelets: 229 10*3/uL (ref 150.0–400.0)
RBC: 4.74 Mil/uL (ref 4.22–5.81)
RDW: 13.1 % (ref 11.5–14.6)

## 2013-03-13 LAB — COMPREHENSIVE METABOLIC PANEL
ALT: 18 U/L (ref 0–53)
AST: 23 U/L (ref 0–37)
Albumin: 4 g/dL (ref 3.5–5.2)
Alkaline Phosphatase: 43 U/L (ref 39–117)
BUN: 20 mg/dL (ref 6–23)
Calcium: 9 mg/dL (ref 8.4–10.5)
Chloride: 106 mEq/L (ref 96–112)
Potassium: 4.9 mEq/L (ref 3.5–5.1)
Sodium: 141 mEq/L (ref 135–145)

## 2013-03-13 LAB — LIPID PANEL
Cholesterol: 226 mg/dL — ABNORMAL HIGH (ref 0–200)
HDL: 73.9 mg/dL (ref >39.00)
Triglycerides: 43 mg/dL (ref 0.0–149.0)
VLDL: 8.6 mg/dL (ref 0.0–40.0)

## 2013-03-13 LAB — PSA: PSA: 1.32 ng/mL (ref 0.10–4.00)

## 2013-03-13 NOTE — Patient Instructions (Signed)
Health Maintenance, Males A healthy lifestyle and preventative care can promote health and wellness.  Maintain regular health, dental, and eye exams.  Eat a healthy diet. Foods like vegetables, fruits, whole grains, low-fat dairy products, and lean protein foods contain the nutrients you need without too many calories. Decrease your intake of foods high in solid fats, added sugars, and salt. Get information about a proper diet from your caregiver, if necessary.  Regular physical exercise is one of the most important things you can do for your health. Most adults should get at least 150 minutes of moderate-intensity exercise (any activity that increases your heart rate and causes you to sweat) each week. In addition, most adults need muscle-strengthening exercises on 2 or more days a week.   Maintain a healthy weight. The body mass index (BMI) is a screening tool to identify possible weight problems. It provides an estimate of body fat based on height and weight. Your caregiver can help determine your BMI, and can help you achieve or maintain a healthy weight. For adults 20 years and older:  A BMI below 18.5 is considered underweight.  A BMI of 18.5 to 24.9 is normal.  A BMI of 25 to 29.9 is considered overweight.  A BMI of 30 and above is considered obese.  Maintain normal blood lipids and cholesterol by exercising and minimizing your intake of saturated fat. Eat a balanced diet with plenty of fruits and vegetables. Blood tests for lipids and cholesterol should begin at age 20 and be repeated every 5 years. If your lipid or cholesterol levels are high, you are over 50, or you are a high risk for heart disease, you may need your cholesterol levels checked more frequently.Ongoing high lipid and cholesterol levels should be treated with medicines, if diet and exercise are not effective.  If you smoke, find out from your caregiver how to quit. If you do not use tobacco, do not start.  If you  choose to drink alcohol, do not exceed 2 drinks per day. One drink is considered to be 12 ounces (355 mL) of beer, 5 ounces (148 mL) of wine, or 1.5 ounces (44 mL) of liquor.  Avoid use of street drugs. Do not share needles with anyone. Ask for help if you need support or instructions about stopping the use of drugs.  High blood pressure causes heart disease and increases the risk of stroke. Blood pressure should be checked at least every 1 to 2 years. Ongoing high blood pressure should be treated with medicines if weight loss and exercise are not effective.  If you are 45 to 51 years old, ask your caregiver if you should take aspirin to prevent heart disease.  Diabetes screening involves taking a blood sample to check your fasting blood sugar level. This should be done once every 3 years, after age 45, if you are within normal weight and without risk factors for diabetes. Testing should be considered at a younger age or be carried out more frequently if you are overweight and have at least 1 risk factor for diabetes.  Colorectal cancer can be detected and often prevented. Most routine colorectal cancer screening begins at the age of 50 and continues through age 75. However, your caregiver may recommend screening at an earlier age if you have risk factors for colon cancer. On a yearly basis, your caregiver may provide home test kits to check for hidden blood in the stool. Use of a small camera at the end of a tube,   to directly examine the colon (sigmoidoscopy or colonoscopy), can detect the earliest forms of colorectal cancer. Talk to your caregiver about this at age 50, when routine screening begins. Direct examination of the colon should be repeated every 5 to 10 years through age 75, unless early forms of pre-cancerous polyps or small growths are found.  Hepatitis C blood testing is recommended for all people born from 1945 through 1965 and any individual with known risks for hepatitis C.  Healthy  men should no longer receive prostate-specific antigen (PSA) blood tests as part of routine cancer screening. Consult with your caregiver about prostate cancer screening.  Testicular cancer screening is not recommended for adolescents or adult males who have no symptoms. Screening includes self-exam, caregiver exam, and other screening tests. Consult with your caregiver about any symptoms you have or any concerns you have about testicular cancer.  Practice safe sex. Use condoms and avoid high-risk sexual practices to reduce the spread of sexually transmitted infections (STIs).  Use sunscreen with a sun protection factor (SPF) of 30 or greater. Apply sunscreen liberally and repeatedly throughout the day. You should seek shade when your shadow is shorter than you. Protect yourself by wearing long sleeves, pants, a wide-brimmed hat, and sunglasses year round, whenever you are outdoors.  Notify your caregiver of new moles or changes in moles, especially if there is a change in shape or color. Also notify your caregiver if a mole is larger than the size of a pencil eraser.  A one-time screening for abdominal aortic aneurysm (AAA) and surgical repair of large AAAs by sound wave imaging (ultrasonography) is recommended for ages 65 to 75 years who are current or former smokers.  Stay current with your immunizations. Document Released: 02/04/2008 Document Revised: 10/31/2011 Document Reviewed: 01/03/2011 [employer] Patient Information 2014 [employer].  

## 2013-03-13 NOTE — Assessment & Plan Note (Signed)
General surgery referral 

## 2013-03-13 NOTE — Assessment & Plan Note (Signed)
Exam done  He was referred for a colonoscopy Vaccines were reviewed and updated Labs ordered Pt ed material was given

## 2013-03-13 NOTE — Progress Notes (Signed)
Subjective:    Patient ID: Matthew Richardson, male    DOB: 01/19/1961, 52 y.o.   MRN: [id number]  HPI Comments: He returns for a physical and complains that after he did some heavy lifting a few months ago he noticed a bulge above his belly button. It is not painful.     Review of Systems  Constitutional: Positive for unexpected weight change (weight gain). Negative for fever, chills, diaphoresis, activity change, appetite change and fatigue.  HENT: Negative.   Eyes: Negative.   Respiratory: Negative.  Negative for cough, chest tightness, shortness of breath, wheezing and stridor.   Cardiovascular: Negative.  Negative for chest pain, palpitations and leg swelling.  Gastrointestinal: Negative.  Negative for nausea, vomiting, abdominal pain, diarrhea, constipation and blood in stool.  Endocrine: Negative.   Genitourinary: Negative.  Negative for dysuria, flank pain, decreased urine volume, discharge, scrotal swelling, difficulty urinating and testicular pain.  Musculoskeletal: Negative.   Skin: Negative.   Allergic/Immunologic: Negative.   Neurological: Negative.  Negative for dizziness and light-headedness.  Hematological: Negative for adenopathy. Does not bruise/bleed easily.  Psychiatric/Behavioral: Negative.        Objective:   Physical Exam  Vitals reviewed. Constitutional: He is oriented to person, place, and time. He appears well-developed and well-nourished. No distress.  HENT:  Head: Normocephalic and atraumatic.  Mouth/Throat: Oropharynx is clear and moist. No oropharyngeal exudate.  Eyes: Conjunctivae are normal. Right eye exhibits no discharge. Left eye exhibits no discharge. No scleral icterus.  Neck: Normal range of motion. Neck supple. No JVD present. No tracheal deviation present. No thyromegaly present.  Cardiovascular: Normal rate, regular rhythm, normal heart sounds and intact distal pulses.  Exam reveals no gallop and no friction rub.   No murmur  heard. Pulmonary/Chest: Effort normal and breath sounds normal. No stridor. No respiratory distress. He has no wheezes. He has no rales. He exhibits no tenderness.  Abdominal: Soft. Normal appearance and bowel sounds are normal. He exhibits no shifting dullness, no distension, no pulsatile liver, no fluid wave, no abdominal bruit, no ascites, no pulsatile midline mass and no mass. There is no hepatosplenomegaly, splenomegaly or hepatomegaly. There is no tenderness. There is no rigidity, no rebound, no guarding, no CVA tenderness, no tenderness at McBurney's point and negative Murphy's sign. A hernia is present. Hernia confirmed positive in the ventral area (umbilical hernia). Hernia confirmed negative in the right inguinal area and confirmed negative in the left inguinal area.  Genitourinary: Rectum normal, prostate normal, testes normal and penis normal. Rectal exam shows no external hemorrhoid, no internal hemorrhoid, no fissure, no mass, no tenderness and anal tone normal. Guaiac negative stool. Prostate is not enlarged and not tender. Right testis shows no mass, no swelling and no tenderness. Right testis is descended. Left testis shows no mass, no swelling and no tenderness. Left testis is descended. Circumcised. No penile erythema or penile tenderness. No discharge found.  Musculoskeletal: Normal range of motion. He exhibits no edema and no tenderness.  Lymphadenopathy:    He has no cervical adenopathy.       Right: No inguinal adenopathy present.       Left: No inguinal adenopathy present.  Neurological: He is oriented to person, place, and time.  Skin: Skin is warm and dry. No rash noted. He is not diaphoretic. No erythema. No pallor.  Psychiatric: He has a normal mood and affect. His behavior is normal. Judgment and thought content normal.     Lab Results  Component Value Date  WBC 4.8 03/16/2012   HGB 14.3 03/16/2012   HCT 41.4 03/16/2012   PLT 196.0 03/16/2012   GLUCOSE 116* 03/16/2012    CHOL 225* 03/16/2012   TRIG 47.0 03/16/2012   HDL 96.90 03/16/2012   LDLDIRECT 109.4 03/16/2012   ALT 15 03/16/2012   AST 22 03/16/2012   NA 140 03/16/2012   K 4.7 03/16/2012   CL 104 03/16/2012   CREATININE 1.1 03/16/2012   BUN 15 03/16/2012   CO2 28 03/16/2012   TSH 0.77 03/16/2012   INR 1.1 RATIO* 07/18/2008   HGBA1C 5.2 03/16/2012       Assessment & Plan:

## 2013-03-14 ENCOUNTER — Encounter: Payer: Self-pay | Admitting: Internal Medicine

## 2013-03-26 ENCOUNTER — Encounter: Payer: Self-pay | Admitting: Internal Medicine

## 2013-03-27 ENCOUNTER — Ambulatory Visit (INDEPENDENT_AMBULATORY_CARE_PROVIDER_SITE_OTHER): Payer: Commercial Managed Care - PPO | Admitting: General Surgery

## 2013-03-27 ENCOUNTER — Encounter (INDEPENDENT_AMBULATORY_CARE_PROVIDER_SITE_OTHER): Payer: Self-pay | Admitting: General Surgery

## 2013-03-27 VITALS — BP 150/80 | HR 56 | Resp 16 | Ht 71.0 in | Wt 210.4 lb

## 2013-03-27 DIAGNOSIS — K429 Umbilical hernia without obstruction or gangrene: Secondary | ICD-10-CM

## 2013-03-27 NOTE — Patient Instructions (Signed)
Please call if you develop symptoms We will bring you back in December for a quick visit and schedule surgery for January 2015  Hernia A hernia occurs when an internal organ pushes out through a weak spot in the abdominal wall. Hernias most commonly occur in the groin and around the navel. Hernias often can be pushed back into place (reduced). Most hernias tend to get worse over time. Some abdominal hernias can get stuck in the opening (irreducible or incarcerated hernia) and cannot be reduced. An irreducible abdominal hernia which is tightly squeezed into the opening is at risk for impaired blood supply (strangulated hernia). A strangulated hernia is a medical emergency. Because of the risk for an irreducible or strangulated hernia, surgery may be recommended to repair a hernia. CAUSES   Heavy lifting.  Prolonged coughing.  Straining to have a bowel movement.  A cut (incision) made during an abdominal surgery. HOME CARE INSTRUCTIONS   Bed rest is not required. You may continue your normal activities.  Cough gently. If you are a smoker it is best to stop. Even the best hernia repair can break down with the continual strain of coughing. Even if you do not have your hernia repaired, a cough will continue to aggravate the problem.  Do not wear anything tight over your hernia. Do not try to keep it in with an outside bandage or truss. These can damage abdominal contents if they are trapped within the hernia sac.  Eat a normal diet.  Avoid constipation. Straining over long periods of time will increase hernia size and encourage breakdown of repairs. If you cannot do this with diet alone, stool softeners may be used. SEEK IMMEDIATE MEDICAL CARE IF:   You have a fever.  You develop increasing abdominal pain.  You feel nauseous or vomit.  Your hernia is stuck outside the abdomen, looks discolored, feels hard, or is tender.  You have any changes in your bowel habits or in the hernia that  are unusual for you.  You have increased pain or swelling around the hernia.  You cannot push the hernia back in place by applying gentle pressure while lying down. MAKE SURE YOU:   Understand these instructions.  Will watch your condition.  Will get help right away if you are not doing well or get worse. Document Released: 08/08/2005 Document Revised: 10/31/2011 Document Reviewed: 03/27/2008 [facility] Patient Information 2014 [place], [place].

## 2013-03-27 NOTE — Progress Notes (Signed)
Patient ID: Matthew Richardson, male   DOB: 1961-02-01, 52 y.o.   MRN: [id number]  Chief Complaint  Patient presents with  . New Evaluation    eval umb. hernia    HPI Matthew Richardson is a 52 y.o. male.   HPI 52 year old Caucasian male referred by Dr. Yetta Barre for evaluation of an umbilical hernia. The patient states that he first noticed it about 8 months ago. He states it'll cause him some discomfort if he presses on the area. He denies any fever, chills, nausea, vomiting, diarrhea or constipation. He denies any prior abdominal surgery. He generally runs about 5 miles a day. Nonetheless he has gained about 25 pounds over the past 8 months. His stepdaughter died unexpectedly back in the spring and he changed his eating habits at that time. Since then he has been trying to lose the weight he gained. He states that he does a lot of heavy lifting and twisting at work. He works in a Naval architect at [employer]. He states they're getting ready for their busiest season.   Past Medical History  Diagnosis Date  . Anemia   . Asthma   . Hypertension   . History of nephrolithiasis   . GERD (gastroesophageal reflux disease)     Past Surgical History  Procedure Laterality Date  . Denies surgical hx      Family History  Problem Relation Age of Onset  . Diabetes Mother   . Colon cancer Other     1st degree relative <60  . Cancer Neg Hx   . Alcohol abuse Neg Hx   . Drug abuse Neg Hx   . Early death Neg Hx   . Hearing loss Neg Hx   . Heart disease Neg Hx   . Hyperlipidemia Neg Hx   . Hypertension Neg Hx   . Kidney disease Neg Hx   . Stroke Neg Hx     Social History History  Substance Use Topics  . Smoking status: Former Smoker -- 1.00 packs/day for 20 years    Types: Cigarettes    Quit date: 08/22/2001  . Smokeless tobacco: Never Used  . Alcohol Use: No    Allergies  Allergen Reactions  . Penicillins   . Singulair (Montelukast Sodium) Nausea Only  . Sulfamethoxazole W-Trimethoprim   . Sulfonamide  Derivatives     No current outpatient prescriptions on file.   No current facility-administered medications for this visit.    Review of Systems Review of Systems  Constitutional: Negative for fever, chills, appetite change and unexpected weight change.  HENT: Negative for congestion and trouble swallowing.   Eyes: Negative for visual disturbance.  Respiratory: Negative for chest tightness and shortness of breath.   Cardiovascular: Negative for chest pain and leg swelling.       No PND, no orthopnea, no DOE  Gastrointestinal:       See HPI  Genitourinary: Negative for dysuria and hematuria.  Musculoskeletal: Negative.   Skin: Negative for rash.  Neurological: Negative for seizures and speech difficulty.  Hematological: Does not bruise/bleed easily.  Psychiatric/Behavioral: Negative for behavioral problems and confusion.    Blood pressure 150/80, pulse 56, resp. rate 16, height 5\' 11"  (1.803 m), weight 210 lb 6.4 oz (95.437 kg).  Physical Exam Physical Exam  Constitutional: He is oriented to person, place, and time. He appears well-developed and well-nourished. No distress.  HENT:  Head: Normocephalic and atraumatic.  Right Ear: External ear normal.  Left Ear: External ear normal.  Eyes: Conjunctivae are normal.  No scleral icterus.  Neck: Normal range of motion. Neck supple. No tracheal deviation present. No thyromegaly present.  Cardiovascular: Normal rate, normal heart sounds and intact distal pulses.   Pulmonary/Chest: Effort normal and breath sounds normal. No respiratory distress. He has no wheezes.  Abdominal: Soft. He exhibits no distension. There is no tenderness. There is no rebound and no guarding.    Small supraumbilical/umbilical hernia; reducible. No skin changes. Defect prob about 1.5cm.   Musculoskeletal: Normal range of motion. He exhibits no edema and no tenderness.  Lymphadenopathy:    He has no cervical adenopathy.  Neurological: He is alert and  oriented to person, place, and time. He exhibits normal muscle tone.  Skin: Skin is warm and dry. No rash noted. He is not diaphoretic. No erythema. No pallor.  Psychiatric: He has a normal mood and affect. His behavior is normal. Judgment and thought content normal.    Data Reviewed Dr Yetta Barre note from 7/23 Dr Maple Hudson note from 06/2012 - mild OSA CT scan - 2013 - small umbilical hernia  Assessment    Umbilical hernia     Plan    We discussed the etiology of umbilical hernias. We discussed the signs and symptoms of incarceration and strangulation. The patient was given educational material. I also drew diagrams.  We discussed nonoperative and operative management. With respect to operative management, we discussed open repair   We discussed the risk and benefits of surgery including but not limited to bleeding, infection, injury to surrounding structures, hernia recurrence, mesh complications, pain, hematoma/seroma formation, blood clot formation, urinary retention, general anesthesia risk. We discussed the importance of avoiding heavy lifting and straining for a period of 6 weeks.   The patient has elected to To wait until the first of the year to have his hernia repaired. He states that he really can't afford to take the time off from work right now to have it repaired since they are getting ready to get very busy.  He will be scheduled for a recall appointment in mid December to touch base. at that time we will schedule surgery for January 2015. I believe it is okay for him to wait until then. We did discuss signs and symptoms of incarceration. He was instructed to call with any questions or worsening symptoms  Mary Sella. Andrey Campanile, MD, FACS General, Bariatric, & Minimally Invasive Surgery [facility], [place]        [facility] M 03/27/2013, 10:19 AM

## 2013-05-24 ENCOUNTER — Ambulatory Visit (AMBULATORY_SURGERY_CENTER): Payer: 59 | Admitting: *Deleted

## 2013-05-24 VITALS — Ht 71.0 in | Wt 207.6 lb

## 2013-05-24 DIAGNOSIS — Z1211 Encounter for screening for malignant neoplasm of colon: Secondary | ICD-10-CM

## 2013-05-24 MED ORDER — MOVIPREP 100 G PO SOLR: ORAL | Status: DC

## 2013-05-24 NOTE — Progress Notes (Signed)
No allergies to eggs or soy. No problems with anesthesia.  

## 2013-05-31 ENCOUNTER — Telehealth: Payer: Self-pay | Admitting: Internal Medicine

## 2013-05-31 DIAGNOSIS — Z1211 Encounter for screening for malignant neoplasm of colon: Secondary | ICD-10-CM

## 2013-05-31 MED ORDER — MOVIPREP 100 G PO SOLR: 1.0000 | Freq: Once | ORAL | Status: DC

## 2013-05-31 NOTE — Telephone Encounter (Signed)
Patient would like moviprep prescription to be reordered at [facility] outpatient pharmacy rather than [facility] pharmacy. Completed

## 2013-06-05 ENCOUNTER — Encounter: Payer: Self-pay | Admitting: Internal Medicine

## 2013-06-05 ENCOUNTER — Ambulatory Visit (AMBULATORY_SURGERY_CENTER): Payer: 59 | Admitting: Internal Medicine

## 2013-06-05 VITALS — BP 124/78 | HR 45 | Temp 97.1°F | Resp 18 | Ht 71.0 in | Wt 207.0 lb

## 2013-06-05 DIAGNOSIS — Z1211 Encounter for screening for malignant neoplasm of colon: Secondary | ICD-10-CM

## 2013-06-05 DIAGNOSIS — D126 Benign neoplasm of colon, unspecified: Secondary | ICD-10-CM

## 2013-06-05 MED ORDER — SODIUM CHLORIDE 0.9 % IV SOLN: 500.0000 mL | INTRAVENOUS | Status: DC

## 2013-06-05 NOTE — Progress Notes (Signed)
No complaints noted in the recovery room. Maw   

## 2013-06-05 NOTE — Progress Notes (Signed)
Called to room to assist during endoscopic procedure.  Patient ID and intended procedure confirmed with present staff. Received instructions for my participation in the procedure from the performing physician. ewm 

## 2013-06-05 NOTE — Progress Notes (Signed)
Procedure ends, to recovery, report given and VSS. 

## 2013-06-05 NOTE — Patient Instructions (Signed)
YOU HAD AN ENDOSCOPIC PROCEDURE TODAY AT THE [facility]: Refer to the procedure report that was given to you for any specific questions about what was found during the examination.  If the procedure report does not answer your questions, please call your gastroenterologist to clarify.  If you requested that your care partner not be given the details of your procedure findings, then the procedure report has been included in a sealed envelope for you to review at your convenience later.  YOU SHOULD EXPECT: Some feelings of bloating in the abdomen. Passage of more gas than usual.  Walking can help get rid of the air that was put into your GI tract during the procedure and reduce the bloating. If you had a lower endoscopy (such as a colonoscopy or flexible sigmoidoscopy) you may notice spotting of blood in your stool or on the toilet paper. If you underwent a bowel prep for your procedure, then you may not have a normal bowel movement for a few days.  DIET: Your first meal following the procedure should be a light meal and then it is ok to progress to your normal diet.  A half-sandwich or bowl of soup is an example of a good first meal.  Heavy or fried foods are harder to digest and may make you feel nauseous or bloated.  Likewise meals heavy in dairy and vegetables can cause extra gas to form and this can also increase the bloating.  Drink plenty of fluids but you should avoid alcoholic beverages for 24 hours.  ACTIVITY: Your care partner should take you home directly after the procedure.  You should plan to take it easy, moving slowly for the rest of the day.  You can resume normal activity the day after the procedure however you should NOT DRIVE or use heavy machinery for 24 hours (because of the sedation medicines used during the test).    SYMPTOMS TO REPORT IMMEDIATELY: A gastroenterologist can be reached at any hour.  During normal business hours, 8:30 AM to 5:00 PM Monday through Friday,  call [phone].  After hours and on weekends, please call the GI answering service at [phone] who will take a message and have the physician on call contact you.   Following lower endoscopy (colonoscopy or flexible sigmoidoscopy):  Excessive amounts of blood in the stool  Significant tenderness or worsening of abdominal pains  Swelling of the abdomen that is new, acute  Fever of 100F or higher   FOLLOW UP: If any biopsies were taken you will be contacted by phone or by letter within the next 1-3 weeks.  Call your gastroenterologist if you have not heard about the biopsies in 3 weeks.  Our staff will call the home number listed on your records the next business day following your procedure to check on you and address any questions or concerns that you may have at that time regarding the information given to you following your procedure. This is a courtesy call and so if there is no answer at the home number and we have not heard from you through the emergency physician on call, we will assume that you have returned to your regular daily activities without incident.  SIGNATURES/CONFIDENTIALITY: You and/or your care partner have signed paperwork which will be entered into your electronic medical record.  These signatures attest to the fact that that the information above on your After Visit Summary has been reviewed and is understood.  Full responsibility of the confidentiality of   this discharge information lies with you and/or your care-partner.    Handouts were given to your care partner on polyps, diverticulosis, a high fiber diet and hemorrhoids. You may resume your current medications today. Please call if any questions or concerns.

## 2013-06-05 NOTE — Op Note (Signed)
[facility] [address] [place], [address]   COLONOSCOPY PROCEDURE REPORT  PATIENT: Chinedum, Vanhouten  MR#: [id number] BIRTHDATE: 1961/07/05 , 52  yrs. old GENDER: Male ENDOSCOPIST: Beverley Fiedler, MD REFERRED NW:GNFAOZ Karsten Ro, M.D. PROCEDURE DATE:  06/05/2013 PROCEDURE:   Colonoscopy with cold biopsy polypectomy First Screening Colonoscopy - Avg.  risk and is 50 yrs.  old or older Yes.  Prior Negative Screening - Now for repeat screening. N/A  History of Adenoma - Now for follow-up colonoscopy & has been > or = to 3 yrs.  N/A  Polyps Removed Today? Yes. ASA CLASS:   Class II INDICATIONS:average risk screening and first colonoscopy. MEDICATIONS: MAC sedation, administered by CRNA and Propofol (Diprivan) 270 mg IV  DESCRIPTION OF PROCEDURE:   After the risks benefits and alternatives of the procedure were thoroughly explained, informed consent was obtained.  A digital rectal exam revealed no rectal mass.   The LB [id number]  endoscope was introduced through the anus and advanced to the cecum, which was identified by both the appendix and ileocecal valve. No adverse events experienced. The quality of the prep was good, using MoviPrep  The instrument was then slowly withdrawn as the colon was fully examined.   COLON FINDINGS: Three sessile polyps ranging between 3-73mm in size were found at the cecum (1), in the sigmoid colon (1), and rectum (1).  Polypectomy was performed using cold snare.  All resections were complete and all polyp tissue was completely retrieved.   Mild diverticulosis was noted in the descending colon and sigmoid colon. Retroflexed views revealed internal hemorrhoids. The time to cecum=3 minutes 37 seconds.  Withdrawal time=12 minutes 39 seconds. The scope was withdrawn and the procedure completed. COMPLICATIONS: There were no complications.  ENDOSCOPIC IMPRESSION: 1.   Three sessile polyps ranging between 3-52mm in size were found at the  cecum, in the sigmoid colon, and rectum; Polypectomy was performed using cold snare 2.   Mild diverticulosis was noted in the descending colon and sigmoid colon  RECOMMENDATIONS: 1.  Await pathology results 2.  High fiber diet 3.  Timing of repeat colonoscopy will be determined by pathology findings. 4.  You will receive a letter within 1-2 weeks with the results of your biopsy as well as final recommendations.  Please call my office if you have not received a letter after 3 weeks.   eSigned:  Beverley Fiedler, MD 06/05/2013 8:55 AM cc: The Patient and Etta Grandchild, MD

## 2013-06-06 ENCOUNTER — Telehealth: Payer: Self-pay | Admitting: *Deleted

## 2013-06-06 NOTE — Telephone Encounter (Signed)
  Follow up Call-  Call back number 06/05/2013  Post procedure Call Back phone  # [phone]  Permission to leave phone message Yes     Patient questions:  Do you have a fever, pain , or abdominal swelling? no Pain Score  0 *  Have you tolerated food without any problems? yes  Have you been able to return to your normal activities? yes  Do you have any questions about your discharge instructions: Diet   no Medications  no Follow up visit  no  Do you have questions or concerns about your Care? no  Actions: * If pain score is 4 or above: No action needed, pain <4.

## 2013-06-10 ENCOUNTER — Encounter: Payer: Self-pay | Admitting: Internal Medicine

## 2013-06-10 LAB — HM COLONOSCOPY

## 2013-06-25 ENCOUNTER — Encounter (INDEPENDENT_AMBULATORY_CARE_PROVIDER_SITE_OTHER): Payer: Self-pay | Admitting: General Surgery

## 2013-06-29 IMAGING — CT CT ABD-PELV W/O CM
2 of 4 series · 17 of 46 positions shown, 19 images · non-contrast
Comparison: None

CLINICAL DATA: Evaluate for renal calculi

CT ABDOMEN AND PELVIS WITHOUT CONTRAST
TECHNIQUE: Multidetector CT imaging of the abdomen and pelvis was
performed following the standard protocol without intravenous
contrast.

[Series 2: ap stone study · axial · 0.74mm/px · z∈[-480,-45]mm · 14 of 95 slices shown, 16 images]
[im 4/95  soft-tissue]
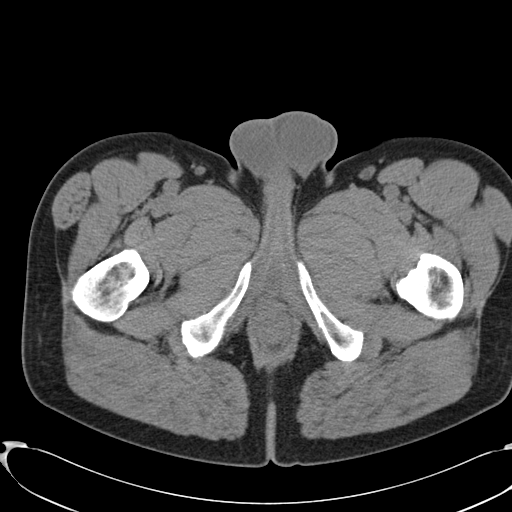
[im 4/95  bone]
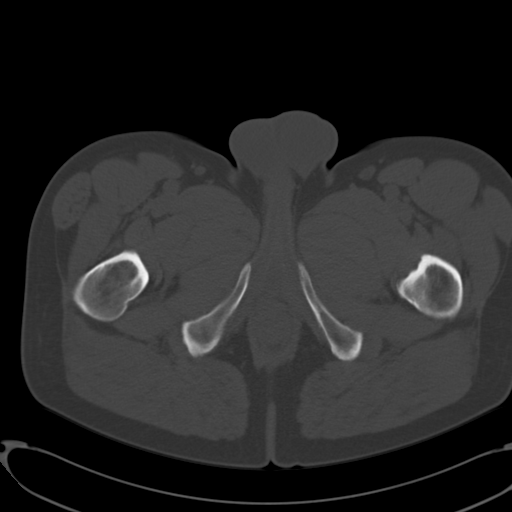
[im 11/95  soft-tissue]
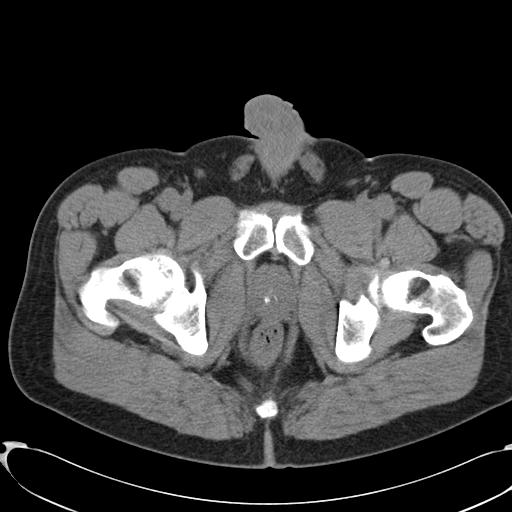
[im 19/95  soft-tissue]
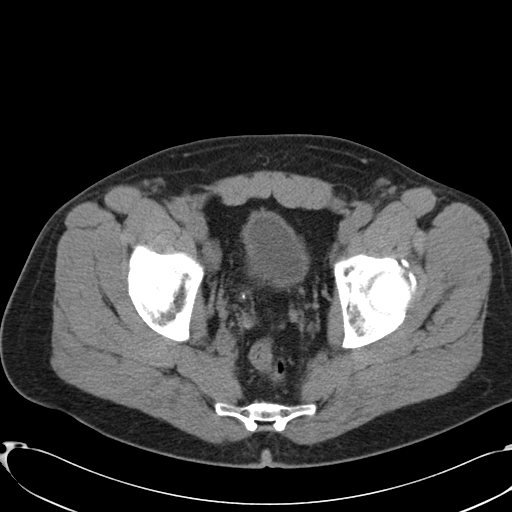
[im 26/95  soft-tissue]
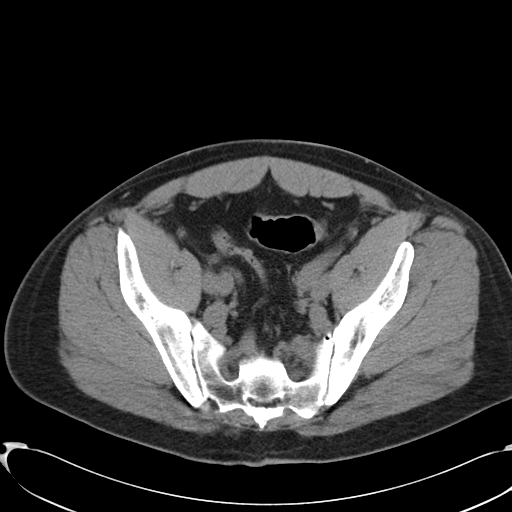
[im 33/95  soft-tissue]
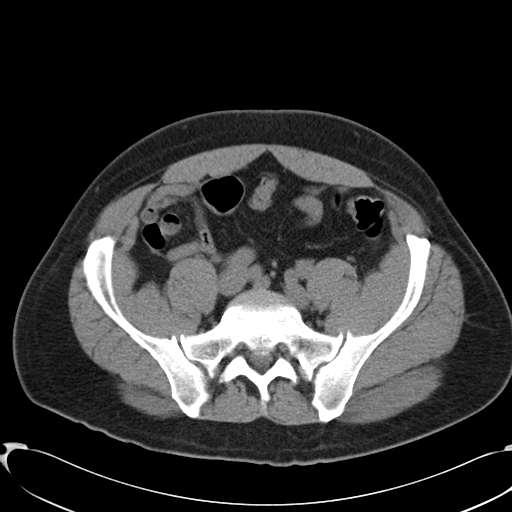
[im 37/95  soft-tissue]
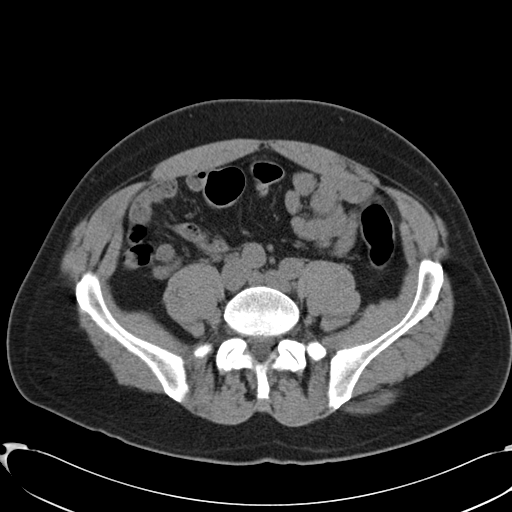
[im 44/95  soft-tissue]
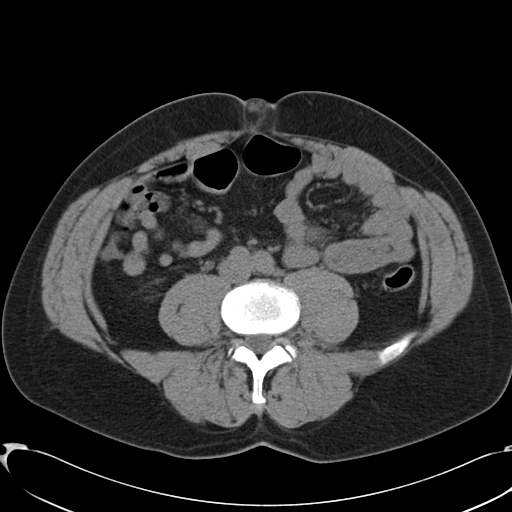
[im 51/95  soft-tissue]
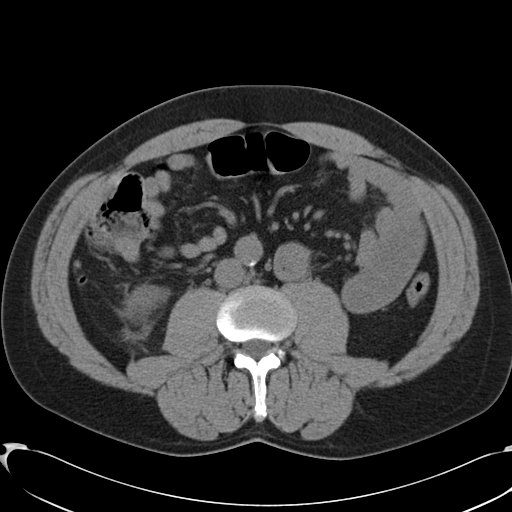
[im 58/95  soft-tissue]
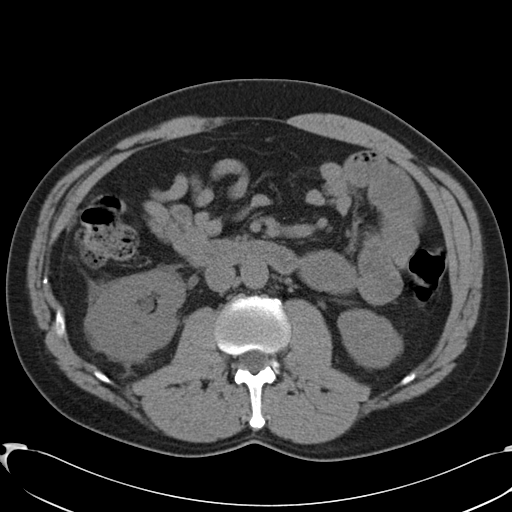
[im 58/95  bone]
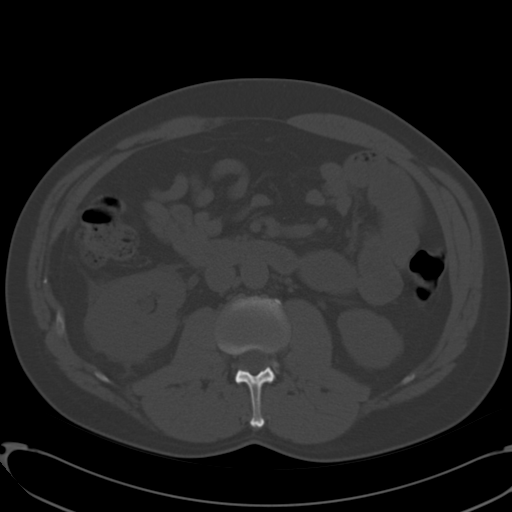
[im 62/95  soft-tissue]
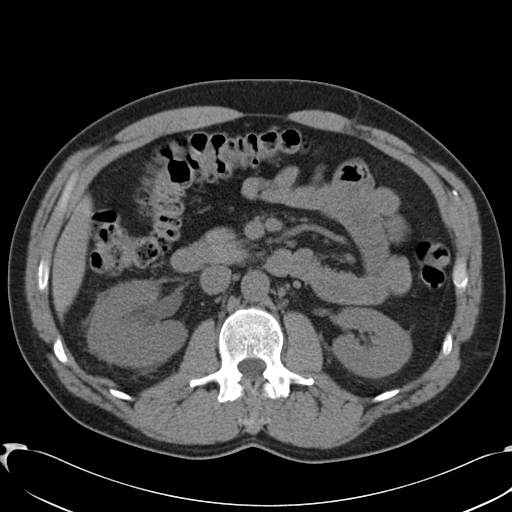
[im 69/95  soft-tissue]
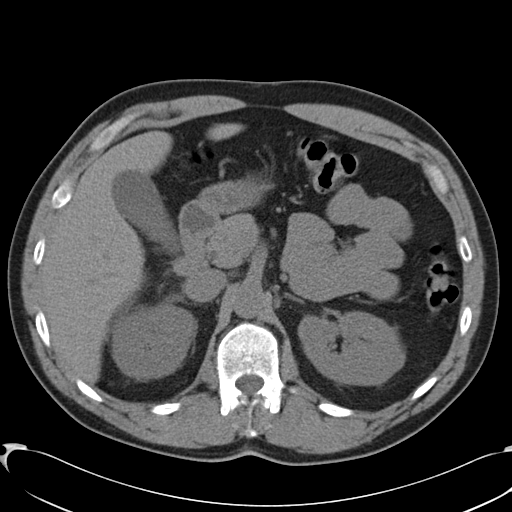
[im 76/95  soft-tissue]
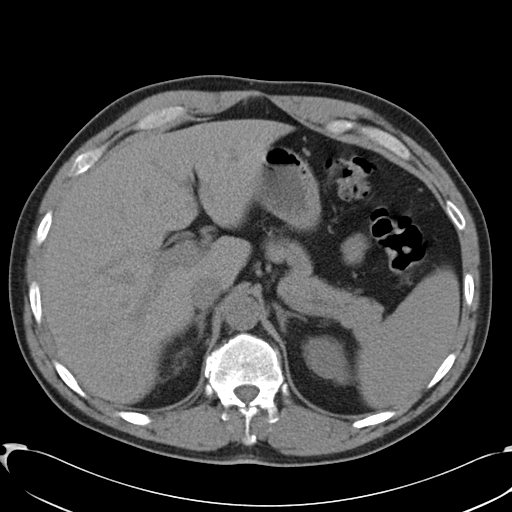
[im 84/95  soft-tissue]
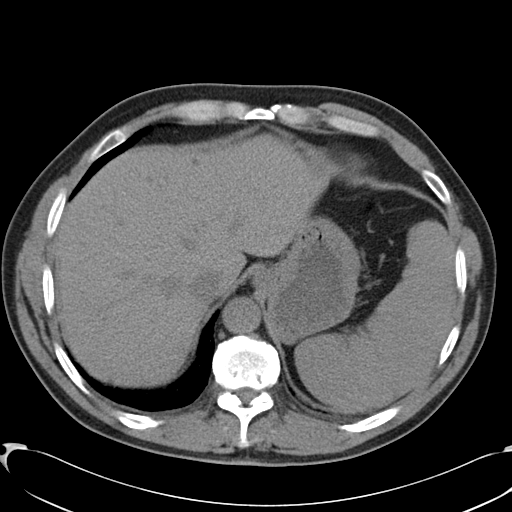
[im 91/95  soft-tissue]
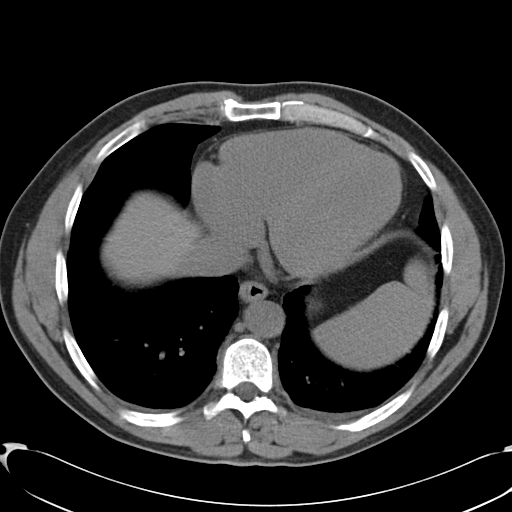

[Series 602: cor · coronal · 0.96mm/px · 3 of 112 slices shown]
[im 38/112  soft-tissue]
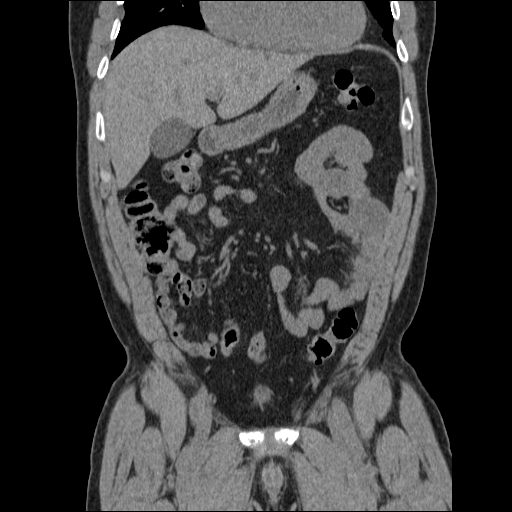
[im 50/112  soft-tissue]
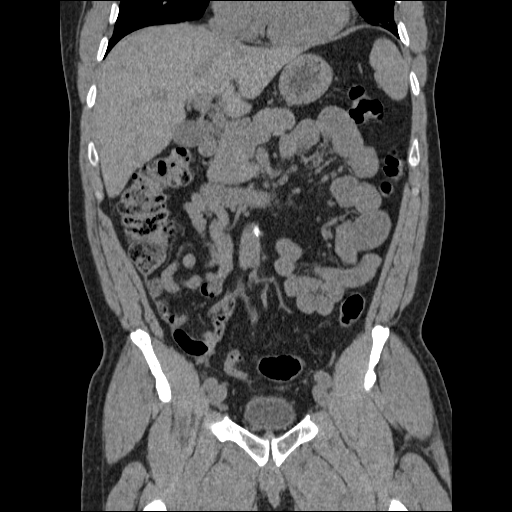
[im 62/112  soft-tissue]
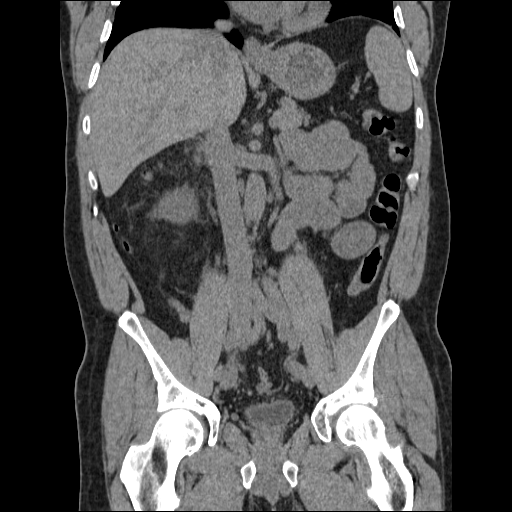

[17 of 46 positions shown; findings below may reference images not displayed]

FINDINGS: The lung bases are clear.

Very small left pleural effusion.  There are multiple small
hypodensities noted within the left hepatic lobe.  Too small to
characterize but likely small cysts the gallbladder appears normal.
No biliary dilatation.  The pancreas is within normal limits.
Normal appearance of the spleen.

The stomach and duodenal C-loop appear normal.  There is a focal
loop of jejunum within the left abdomen which is increased in
caliber measuring 3.3 cm, image 40.  The mid and distal small bowel
loops appear normal.  The appendix is visualized and appears
normal.  The colon is unremarkable.

There is no free fluid or fluid collections noted within the
abdomen or pelvis.  No adenopathy identified within the abdomen or
pelvis.

The adrenal glands are both normal.  Within the upper pole of the
left kidney there is a punctate stone measuring 2 mm, image 24.
There is no left hydronephrosis or hydroureter.  Right-sided
hydronephrosis, nephromegaly and perinephric fat stranding is
noted.  Right-sided hydroureter is present.  Within the distal
ureter there is a tiny stone which measures less than 3 mm, image
77.  The bladder appears normal.

There is a small fat containing left inguinal hernia. Periumbilical
hernia is also identified which contains fat only.

Review of the visualized osseous structures is significant for mild
lumbar degenerative disc disease.
IMPRESSION: 1.  Distal right ureteral stone measures less than 3 mm.  There is
associated right-sided hydronephrosis, nephromegaly and perinephric
fat stranding.
2.  Nonobstructing left renal calculus.

## 2013-07-12 ENCOUNTER — Ambulatory Visit (INDEPENDENT_AMBULATORY_CARE_PROVIDER_SITE_OTHER): Payer: 59 | Admitting: Adult Health

## 2013-07-12 ENCOUNTER — Encounter: Payer: Self-pay | Admitting: Adult Health

## 2013-07-12 ENCOUNTER — Other Ambulatory Visit (INDEPENDENT_AMBULATORY_CARE_PROVIDER_SITE_OTHER): Payer: 59

## 2013-07-12 ENCOUNTER — Ambulatory Visit (HOSPITAL_COMMUNITY)
Admission: RE | Admit: 2013-07-12 | Discharge: 2013-07-12 | Disposition: A | Discharge status: Home / Self Care | Payer: 59 | Source: Ambulatory Visit | Attending: Adult Health | Admitting: Adult Health

## 2013-07-12 VITALS — BP 126/66 | HR 43 | Temp 99.2°F | Ht 71.0 in | Wt 209.2 lb

## 2013-07-12 DIAGNOSIS — R109 Unspecified abdominal pain: Secondary | ICD-10-CM | POA: Insufficient documentation

## 2013-07-12 DIAGNOSIS — K4090 Unilateral inguinal hernia, without obstruction or gangrene, not specified as recurrent: Secondary | ICD-10-CM | POA: Insufficient documentation

## 2013-07-12 DIAGNOSIS — K7689 Other specified diseases of liver: Secondary | ICD-10-CM | POA: Insufficient documentation

## 2013-07-12 DIAGNOSIS — K429 Umbilical hernia without obstruction or gangrene: Secondary | ICD-10-CM | POA: Insufficient documentation

## 2013-07-12 DIAGNOSIS — N201 Calculus of ureter: Secondary | ICD-10-CM | POA: Insufficient documentation

## 2013-07-12 DIAGNOSIS — N1330 Unspecified hydronephrosis: Secondary | ICD-10-CM | POA: Insufficient documentation

## 2013-07-12 DIAGNOSIS — N200 Calculus of kidney: Secondary | ICD-10-CM

## 2013-07-12 DIAGNOSIS — N4289 Other specified disorders of prostate: Secondary | ICD-10-CM | POA: Insufficient documentation

## 2013-07-12 LAB — URINALYSIS, ROUTINE W REFLEX MICROSCOPIC
Ketones, ur: NEGATIVE
Leukocytes, UA: NEGATIVE
Urine Glucose: NEGATIVE
Urobilinogen, UA: 0.2 (ref 0.0–1.0)

## 2013-07-12 MED ORDER — HYDROCODONE-ACETAMINOPHEN 10-325 MG PO TABS: 1.0000 | ORAL_TABLET | Freq: Four times a day (QID) | ORAL | Status: DC | PRN

## 2013-07-12 MED ORDER — HYDROCODONE-ACETAMINOPHEN 10-300 MG PO TABS: 1.0000 | ORAL_TABLET | Freq: Four times a day (QID) | ORAL | Status: DC | PRN

## 2013-07-12 NOTE — Assessment & Plan Note (Addendum)
Probable Kidney stone.  Last stone in 01/2012 with associated right sided hydronephrosis   Plan  Refer to Urology for evaluation  Appreciate their assistance.  Will need pain control Dietary restrictions discussed.   Set up CT abd /pelvis -kidney stone protocol  Has ov with urology at 1500 today Dr. Kathrynn Running .

## 2013-07-12 NOTE — Addendum Note (Signed)
Addended by: Boone Master E on: 07/12/2013 02:35 PM   Modules accepted: Orders

## 2013-07-12 NOTE — Addendum Note (Signed)
Addended by: Julio Sicks on: 07/12/2013 01:59 PM   Modules accepted: Orders

## 2013-07-12 NOTE — Patient Instructions (Signed)
Refer to Urology -office visit 07/12/2013 at 3:00 PM at [facility] Dr. Kathrynn Running  We are setting you up for a CT abd and pelvis -kidney stone protogol  Push Fluids  Please contact office for sooner follow up if symptoms do not improve or worsen or seek emergency care

## 2013-07-12 NOTE — Addendum Note (Signed)
Addended by: Reynaldo Minium C on: 07/12/2013 02:19 PM   Modules accepted: Orders

## 2013-07-12 NOTE — Progress Notes (Signed)
Subjective:    Patient ID: Matthew Richardson, male    DOB: November 13, 1960, 52 y.o.   MRN: [id number]  HPI 52 yo male with known hx of GERD followed by Dr. Yetta Barre at [facility]  07/12/2013 Acute OV  Pt presents for an acute office visit.  Says he feels like he has a kidney stone.  Complains that 3 days ago. He started to have some left-sided low back pain. That has waxed and waned and then yesterday, late, he experienced severe left lower back pain with radiating pain around to the left lower quadrant. Pain is described as a wavelike pain. It is quite severe, with associated nausea. He has no associated fever, dysuria, abdominal pain, vomiting, or diarrhea. No recent travel or antibiotic use. Patient says he has had 2 kidney stones in the past. His last kidney stone was June of 2013. A CT abdomen and pelvis showed a distal right ureteral stone, measuring less than 3 mm with associated right-sided hydronephrosis and a left nonobstructing renal calculus.. Patient was referred to urology however, says that he passed a stone and did not have to have urology consult. Has urinary urgency.  Today in the office. Patient is quite uncomfortable and has severe nausea. Patient does state that he drinks quite a bit of soda daily. Minimum, Tea intake. And eat unhealthydiet, with frequent fried foods.   Review of Systems Constitutional:   No  weight loss, night sweats,  Fevers, chills,  +fatigue, or  lassitude.  HEENT:   No headaches,  Difficulty swallowing,  Tooth/dental problems, or  Sore throat,                No sneezing, itching, ear ache, nasal congestion, post nasal drip,   CV:  No chest pain,  Orthopnea, PND, swelling in lower extremities, anasarca, dizziness, palpitations, syncope.   GI  No heartburn, indigestion, abdominal pain, nausea, vomiting, diarrhea, change in bowel habits, loss of appetite, bloody stools.   Resp: No shortness of breath with exertion or at rest.  No excess mucus, no productive cough,   No non-productive cough,  No coughing up of blood.  No change in color of mucus.  No wheezing.  No chest wall deformity  Skin: no rash or lesions.  GU: no dysuria, change in color of urine, + urgency or frequency.  No flank pain, no hematuria   MS:  No joint pain or swelling.  No decreased range of motion.  No back pain.  Psych:  No change in mood or affect. No depression or anxiety.  No memory loss.         Objective:   Physical Exam GEN: A/Ox3; pleasant , NAD, well nourished   HEENT:  [place]/AT,  EACs-clear, TMs-wnl, NOSE-clear, THROAT-clear, no lesions, no postnasal drip or exudate noted.   NECK:  Supple w/ fair ROM; no JVD; normal carotid impulses w/o bruits; no thyromegaly or nodules palpated; no lymphadenopathy.  RESP  Clear  P & A; w/o, wheezes/ rales/ or rhonchi.no accessory muscle use, no dullness to percussion  CARD:  RRR, no m/r/g  , no peripheral edema, pulses intact, no cyanosis or clubbing.  GI:   Soft & nt; nml bowel sounds; no organomegaly or masses detected.  GU: left flank CVA tenderness   Musco: Warm bil, no deformities or joint swelling noted.   Neuro: alert, no focal deficits noted.    Skin: Warm, no lesions or rashes  07/12/2013 UA : trace blood.        Assessment &  Plan:

## 2013-07-12 NOTE — Addendum Note (Signed)
Addended by: Boone Master E on: 07/12/2013 12:09 PM   Modules accepted: Orders

## 2013-07-13 LAB — URINE CULTURE
Colony Count: NO GROWTH
Organism ID, Bacteria: NO GROWTH

## 2013-09-12 ENCOUNTER — Telehealth: Payer: Self-pay | Admitting: Internal Medicine

## 2013-09-12 MED ORDER — OSELTAMIVIR PHOSPHATE 75 MG PO CAPS: 75.0000 mg | ORAL_CAPSULE | Freq: Two times a day (BID) | ORAL | Status: DC

## 2013-09-12 NOTE — Telephone Encounter (Signed)
Exposed to grandchildren w/ flu, now coughing P- Script Tamiflu

## 2014-09-02 ENCOUNTER — Other Ambulatory Visit: Payer: Self-pay | Admitting: Dermatology

## 2014-10-01 ENCOUNTER — Ambulatory Visit (INDEPENDENT_AMBULATORY_CARE_PROVIDER_SITE_OTHER): Payer: BLUE CROSS/BLUE SHIELD | Admitting: Internal Medicine

## 2014-10-01 ENCOUNTER — Encounter: Payer: Self-pay | Admitting: Internal Medicine

## 2014-10-01 DIAGNOSIS — H9311 Tinnitus, right ear: Secondary | ICD-10-CM

## 2014-10-01 MED ORDER — HYDROCHLOROTHIAZIDE 12.5 MG PO CAPS: 12.5000 mg | ORAL_CAPSULE | Freq: Every day | ORAL | Status: DC

## 2014-10-01 NOTE — Patient Instructions (Signed)
   The low-dose fluid pill can be discontinued 48 hours after the tinnitus or ringing resolves.  Go to WebMD for information about tinnitus. Avoid excess aspirin and excess noise exposure. "White noise" machine @ bedside can prevent tinnitus from affecting sleep. If symptoms persist or progress; ENT referral will be completed.

## 2014-10-01 NOTE — Progress Notes (Signed)
   Subjective:    Patient ID: Matthew Richardson, male    DOB: 10/14/1960, 54 y.o.   MRN: [id number]  HPI  He is here with tinnitus in the right ear. 09/23/14 he used a grinder on a metal bolt for about 90 minutes. As of 2/5 he had developed a high-pitched sound in the right ear. Since that time he has been using ear protection when he is using machinery.  He denies any associated hearing loss, pain, or discharge.  There was no trigger , exposure injury such as swimming, flying, or associated upper respiratory tract infection symptoms.  He exercises on a treadmill each am @ high level w/o cardiopulmonary symptoms.  Review of Systems No hearing loss, vertigo or cardiac/neuro prodrome prior to onset of symptoms. Frontal headache, facial pain , nasal purulence, dental pain, sore throat , otic pain or otic discharge denied. No fever , chills or sweats.     Objective:   Physical Exam  Pertinent or positive findings include: He has pattern alopecia. He does wear a goatee. There is a flexion contraction of the right fifth finger. The cranial nerve exam is unremarkable to include whisper testing and tuning fork exam. Romberg and finger-nose testing were also negative. Strength , tone & DTRs WNL.  General appearance:Adequately nourished; no acute distress or increased work of breathing is present.  No  lymphadenopathy about the head, neck, or axilla noted.  Eyes: No conjunctival inflammation or lid edema is present. There is no scleral icterus. Ears:  External ear exam shows no significant lesions or deformities.  Otoscopic examination reveals clear canals, tympanic membranes are intact bilaterally without bulging, retraction, inflammation or discharge. Nose:  External nasal examination shows no deformity or inflammation. Nasal mucosa are pink and moist without lesions or exudates. No septal dislocation or deviation.No obstruction to airflow.  Oral exam: Dental hygiene is good; lips and gums are  healthy appearing.There is no oropharyngeal erythema or exudate noted.  Neck:  No deformities, thyromegaly, masses, or tenderness noted.   Supple with full range of motion without pain.  Heart:  Normal rate and regular rhythm. S1 and S2 normal without gallop, murmur, click, rub or other extra sounds.  Lungs:Chest clear to auscultation; no wheezes, rhonchi,rales ,or rubs present. Extremities:  No cyanosis, edema, or clubbing  noted  Skin: Warm & dry w/o jaundice or tenting.      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 If symptoms persist or progress; ENT referral.

## 2014-10-01 NOTE — Progress Notes (Signed)
Pre visit review using our clinic review tool, if applicable. No additional management support is needed unless otherwise documented below in the visit note. 

## 2014-10-02 ENCOUNTER — Telehealth: Payer: Self-pay | Admitting: Internal Medicine

## 2014-10-02 MED ORDER — HYDROCHLOROTHIAZIDE 12.5 MG PO CAPS
12.5000 mg | ORAL_CAPSULE | Freq: Every day | ORAL | Status: DC
Start: 2014-10-02 — End: 2014-10-06

## 2014-10-02 NOTE — Telephone Encounter (Signed)
Is requesting script to be resent to [address] in [place] for Hydrochlorothiazide.  Patient states pharmacy did not receive.

## 2014-10-02 NOTE — Telephone Encounter (Signed)
Resent

## 2014-10-06 ENCOUNTER — Telehealth: Payer: Self-pay | Admitting: Internal Medicine

## 2014-10-06 MED ORDER — HYDROCHLOROTHIAZIDE 12.5 MG PO CAPS: 12.5000 mg | ORAL_CAPSULE | Freq: Every day | ORAL | Status: DC

## 2014-10-06 NOTE — Telephone Encounter (Signed)
Wife works Engineer, civil (consulting).   States something was to be sent to [facility] on [address] for the ringing and fluid in his ears.  Wife would like a call at [phone].

## 2014-10-06 NOTE — Telephone Encounter (Signed)
Wife advised HCTZ re sent to [facility] in [place] on [address]

## 2014-10-06 NOTE — Telephone Encounter (Signed)
It was HCTZ 12.5 mg ENT referral if no better with this

## 2014-10-10 ENCOUNTER — Encounter: Payer: Self-pay | Admitting: Family

## 2014-10-10 ENCOUNTER — Ambulatory Visit (INDEPENDENT_AMBULATORY_CARE_PROVIDER_SITE_OTHER): Payer: BLUE CROSS/BLUE SHIELD | Admitting: Family

## 2014-10-10 VITALS — BP 134/78 | HR 55 | Temp 98.6°F | Resp 18 | Ht 71.0 in | Wt 193.1 lb

## 2014-10-10 DIAGNOSIS — A084 Viral intestinal infection, unspecified: Secondary | ICD-10-CM | POA: Insufficient documentation

## 2014-10-10 NOTE — Patient Instructions (Addendum)
Thank you for choosing [facility].  Summary/Instructions:   Please continue to stay well-hydrated. Recommend sports drink to start and then gradually increase your diet as you tolerate. Start with bland foods and then progress.   If your symptoms worsen or fail to improve, please contact our office for further instruction, or in case of emergency go directly to the emergency room at the closest medical facility.    Viral Gastroenteritis Viral gastroenteritis is also known as stomach flu. This condition affects the stomach and intestinal tract. It can cause sudden diarrhea and vomiting. The illness typically lasts 3 to 8 days. Most people develop an immune response that eventually gets rid of the virus. While this natural response develops, the virus can make you quite ill. CAUSES  Many different viruses can cause gastroenteritis, such as rotavirus or noroviruses. You can catch one of these viruses by consuming contaminated food or water. You may also catch a virus by sharing utensils or other personal items with an infected person or by touching a contaminated surface. SYMPTOMS  The most common symptoms are diarrhea and vomiting. These problems can cause a severe loss of body fluids (dehydration) and a body salt (electrolyte) imbalance. Other symptoms may include:  Fever.  Headache.  Fatigue.  Abdominal pain. DIAGNOSIS  Your caregiver can usually diagnose viral gastroenteritis based on your symptoms and a physical exam. A stool sample may also be taken to test for the presence of viruses or other infections. TREATMENT  This illness typically goes away on its own. Treatments are aimed at rehydration. The most serious cases of viral gastroenteritis involve vomiting so severely that you are not able to keep fluids down. In these cases, fluids must be given through an intravenous line (IV). HOME CARE INSTRUCTIONS   Drink enough fluids to keep your urine clear or pale yellow. Drink  small amounts of fluids frequently and increase the amounts as tolerated.  Ask your caregiver for specific rehydration instructions.  Avoid:  Foods high in sugar.  Alcohol.  Carbonated drinks.  Tobacco.  Juice.  Caffeine drinks.  Extremely hot or cold fluids.  Fatty, greasy foods.  Too much intake of anything at one time.  Dairy products until 24 to 48 hours after diarrhea stops.  You may consume probiotics. Probiotics are active cultures of beneficial bacteria. They may lessen the amount and number of diarrheal stools in adults. Probiotics can be found in yogurt with active cultures and in supplements.  Wash your hands well to avoid spreading the virus.  Only take over-the-counter or prescription medicines for pain, discomfort, or fever as directed by your caregiver. Do not give aspirin to children. Antidiarrheal medicines are not recommended.  Ask your caregiver if you should continue to take your regular prescribed and over-the-counter medicines.  Keep all follow-up appointments as directed by your caregiver. SEEK IMMEDIATE MEDICAL CARE IF:   You are unable to keep fluids down.  You do not urinate at least once every 6 to 8 hours.  You develop shortness of breath.  You notice blood in your stool or vomit. This may look like coffee grounds.  You have abdominal pain that increases or is concentrated in one small area (localized).  You have persistent vomiting or diarrhea.  You have a fever.  The patient is a child younger than 3 months, and he or she has a fever.  The patient is a child older than 3 months, and he or she has a fever and persistent symptoms.  The  patient is a child older than 3 months, and he or she has a fever and symptoms suddenly get worse.  The patient is a baby, and he or she has no tears when crying. MAKE SURE YOU:   Understand these instructions.  Will watch your condition.  Will get help right away if you are not doing well or  get worse. Document Released: 08/08/2005 Document Revised: 10/31/2011 Document Reviewed: 05/25/2011 [facility] Patient Information 2015 [place], [place]. This information is not intended to replace advice given to you by your health care provider. Make sure you discuss any questions you have with your health care provider.

## 2014-10-10 NOTE — Progress Notes (Signed)
Pre visit review using our clinic review tool, if applicable. No additional management support is needed unless otherwise documented below in the visit note. 

## 2014-10-10 NOTE — Progress Notes (Signed)
   Subjective:    Patient ID: Matthew Richardson, male    DOB: Sep 17, 1960, 54 y.o.   MRN: [id number]  Chief Complaint  Patient presents with  . Abdominal Pain    woke up wed with nausea and cramping, also had an episode of diarrhea that morning, has been taking phenergan which has helped with nausea and his appetite, said yesterday he had pain like he had a basketball sitting in his stomach    HPI:  DHILAN Richardson is a 54 y.o. male who presents today for abdominal pain.  This is a new problem. Associated symptoms of nausea, stomach cramping, and decreased appetite and this has been going on for about 3 days now. Indicates that he is feeling better today and has had decreased diarrhea and is able to keep some food down. Has taken phenergan which has helped with the nausea. Notes that he has recently been around his grandchildren that are experiencing similar symptoms.    Allergies  Allergen Reactions  . Singulair [Montelukast Sodium] Nausea Only  . Sulfamethoxazole-Trimethoprim Nausea And Vomiting  . Sulfonamide Derivatives Nausea And Vomiting  . Penicillins Rash    Current Outpatient Prescriptions on File Prior to Visit  Medication Sig Dispense Refill  . hydrochlorothiazide (MICROZIDE) 12.5 MG capsule Take 1 capsule (12.5 mg total) by mouth daily. 30 capsule 0   No current facility-administered medications on file prior to visit.    Review of Systems  Constitutional: Negative for fever and chills.  Gastrointestinal: Positive for abdominal pain, diarrhea and abdominal distention. Negative for vomiting.      Objective:    BP 134/78 mmHg  Pulse 55  Temp(Src) 98.6 F (37 C) (Oral)  Resp 18  Ht 5\' 11"  (1.803 m)  Wt 193 lb 1.9 oz (87.599 kg)  BMI 26.95 kg/m2  SpO2 97% Nursing note and vital signs reviewed.  Physical Exam  Constitutional: He is oriented to person, place, and time. He appears well-developed and well-nourished. No distress.  Cardiovascular: Normal rate, regular  rhythm, normal heart sounds and intact distal pulses.   Pulmonary/Chest: Effort normal and breath sounds normal.  Abdominal: Soft. Bowel sounds are normal. He exhibits no distension and no mass. There is no tenderness. There is no rebound and no guarding. A hernia is present.  Neurological: He is alert and oriented to person, place, and time.  Skin: Skin is warm and dry.  Psychiatric: He has a normal mood and affect. His behavior is normal. Judgment and thought content normal.       Assessment & Plan:

## 2014-10-10 NOTE — Assessment & Plan Note (Signed)
Symptoms and exam consistent with viral gastroenteritis. Discussed importance of hydrating well and eating a bland diet to start off and progressing his diet as tolerated. Continue over-the-counter medications as needed for symptom relief. Follow-up if symptoms worsen or fail to improve.

## 2014-11-06 ENCOUNTER — Telehealth: Payer: Self-pay | Admitting: Internal Medicine

## 2014-11-06 MED ORDER — DOXYCYCLINE HYCLATE 100 MG PO TABS: 100.0000 mg | ORAL_TABLET | Freq: Two times a day (BID) | ORAL | Status: DC

## 2014-11-06 NOTE — Telephone Encounter (Signed)
Recent Zpak didn't resolve rhinosinusitis Plan- doxycycline sent

## 2015-08-21 ENCOUNTER — Telehealth: Payer: Self-pay | Admitting: Internal Medicine

## 2015-08-21 MED ORDER — AZITHROMYCIN 250 MG PO TABS: ORAL_TABLET | ORAL | Status: DC

## 2015-08-21 NOTE — Telephone Encounter (Signed)
Called spoke with pt. Aware of recs below. He requested I send this to [facility]. I have done so. Suanne Marker (pt spouse) also aware. Nothing further needed

## 2015-08-21 NOTE — Telephone Encounter (Signed)
Offer Zpak  Remind him to stay warm and well hydrated

## 2015-08-21 NOTE — Telephone Encounter (Signed)
Received message, pt c/o cold symptoms x 2 weeks with productive cough w/yellow mucus and PND. Chest tightness when he coughs. Little SOB but not severe. Pt reports getting up a lot of mucus when he coughs and blows his nose. Has been using Allegra OTC, no longer getting any relief from this. Pt is requesting an abx be called into [place]. Pt states that his glands are hurting. Fever ranging 100.1 to 100.8 for 2 days. Pt states that all this started after he was outside blowing leaves about 3 weeks ago.   Allergies  Allergen Reactions  . Singulair [Montelukast Sodium] Nausea Only  . Sulfamethoxazole-Trimethoprim Nausea And Vomiting  . Sulfonamide Derivatives Nausea And Vomiting  . Penicillins Rash     Medication List       This list is accurate as of: 08/21/15  9:45 AM.  Always use your most recent med list.               doxycycline 100 MG tablet  Commonly known as:  VIBRA-TABS  Take 1 tablet (100 mg total) by mouth 2 (two) times daily.     hydrochlorothiazide 12.5 MG capsule  Commonly known as:  MICROZIDE  Take 1 capsule (12.5 mg total) by mouth daily.       Please advise Dr Annamaria Boots. Thanks.

## 2016-01-13 ENCOUNTER — Emergency Department
Admission: EM | Admit: 2016-01-13 | Discharge: 2016-01-13 | Disposition: A | Discharge status: Home / Self Care | Payer: BLUE CROSS/BLUE SHIELD | Attending: Student | Admitting: Student

## 2016-01-13 ENCOUNTER — Emergency Department: Payer: BLUE CROSS/BLUE SHIELD

## 2016-01-13 ENCOUNTER — Encounter: Payer: Self-pay | Admitting: Emergency Medicine

## 2016-01-13 DIAGNOSIS — Z792 Long term (current) use of antibiotics: Secondary | ICD-10-CM | POA: Insufficient documentation

## 2016-01-13 DIAGNOSIS — J45990 Exercise induced bronchospasm: Secondary | ICD-10-CM | POA: Diagnosis not present

## 2016-01-13 DIAGNOSIS — Z87891 Personal history of nicotine dependence: Secondary | ICD-10-CM | POA: Insufficient documentation

## 2016-01-13 DIAGNOSIS — Z79899 Other long term (current) drug therapy: Secondary | ICD-10-CM | POA: Diagnosis not present

## 2016-01-13 DIAGNOSIS — I10 Essential (primary) hypertension: Secondary | ICD-10-CM | POA: Insufficient documentation

## 2016-01-13 DIAGNOSIS — W293XXA Contact with powered garden and outdoor hand tools and machinery, initial encounter: Secondary | ICD-10-CM | POA: Insufficient documentation

## 2016-01-13 DIAGNOSIS — Y929 Unspecified place or not applicable: Secondary | ICD-10-CM | POA: Insufficient documentation

## 2016-01-13 DIAGNOSIS — S61412A Laceration without foreign body of left hand, initial encounter: Secondary | ICD-10-CM | POA: Diagnosis not present

## 2016-01-13 DIAGNOSIS — Y999 Unspecified external cause status: Secondary | ICD-10-CM | POA: Insufficient documentation

## 2016-01-13 DIAGNOSIS — S66922A Laceration of unspecified muscle, fascia and tendon at wrist and hand level, left hand, initial encounter: Secondary | ICD-10-CM

## 2016-01-13 DIAGNOSIS — Y939 Activity, unspecified: Secondary | ICD-10-CM | POA: Diagnosis not present

## 2016-01-13 MED ORDER — OXYCODONE-ACETAMINOPHEN 5-325 MG PO TABS
2.0000 | ORAL_TABLET | Freq: Once | ORAL | Status: AC
  Administered 2016-01-13: 2 via ORAL
  Filled 2016-01-13: qty 2

## 2016-01-13 MED ORDER — HYDROCODONE-ACETAMINOPHEN 5-325 MG PO TABS: 1.0000 | ORAL_TABLET | ORAL | Status: DC | PRN

## 2016-01-13 MED ORDER — TETANUS-DIPHTH-ACELL PERTUSSIS 5-2.5-18.5 LF-MCG/0.5 IM SUSP
0.5000 mL | Freq: Once | INTRAMUSCULAR | Status: AC
  Administered 2016-01-13: 0.5 mL via INTRAMUSCULAR
  Filled 2016-01-13: qty 0.5

## 2016-01-13 MED ORDER — CLINDAMYCIN HCL 150 MG PO CAPS: ORAL_CAPSULE | ORAL | Status: DC

## 2016-01-13 MED ORDER — LIDOCAINE HCL (PF) 1 % IJ SOLN
10.0000 mL | Freq: Once | INTRAMUSCULAR | Status: DC
  Filled 2016-01-13: qty 10

## 2016-01-13 NOTE — ED Notes (Signed)
Applied gutter splint

## 2016-01-13 NOTE — ED Provider Notes (Signed)
[facility] Emergency Department Provider Note   ____________________________________________  Time seen: Approximately 11:50 AM  I have reviewed the triage vital signs and the nursing notes.   HISTORY  Chief Complaint Extremity Laceration   HPI Matthew Richardson is a 55 y.o. male is here with laceration to his left hand. Patient states that he cut his hand on a chop saw. He initially went to [facility] with this laceration but wasn't emergency room for treatment. Patient is unsure of his last tetanus booster. Patient denies any numbness to his digits distally to the injury. Currently there is no bleeding from the laceration and he rates his pain as 2/10.   Past Medical History  Diagnosis Date  . Anemia   . Asthma   . Hypertension   . History of nephrolithiasis   . GERD (gastroesophageal reflux disease)     Patient Active Problem List   Diagnosis Date Noted  . Viral gastroenteritis 10/10/2014  . Kidney stone 07/12/2013  . Routine general medical examination at a health care facility 03/13/2013  . Umbilical hernia [id number]  . Other abnormal glucose 03/16/2012  . Obstructive sleep apnea 12/25/2011  . Asthma, exercise induced 04/29/2011  . 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 Wisdom tooth extraction  1994    Current Outpatient Rx  Name  Route  Sig  Dispense  Refill  . azithromycin (ZITHROMAX) 250 MG tablet      Take as directed   6 tablet   0   . clindamycin (CLEOCIN) 150 MG capsule      Take 2 caps every 6 hours   56 capsule   0   . hydrochlorothiazide (MICROZIDE) 12.5 MG capsule   Oral   Take 1 capsule (12.5 mg total) by mouth daily.   30 capsule   0   . HYDROcodone-acetaminophen (NORCO/VICODIN) 5-325 MG tablet   Oral   Take 1 tablet by mouth every 4 (four) hours as needed for moderate pain.   20 tablet   0     Allergies Singulair;  Sulfamethoxazole-trimethoprim; Sulfonamide derivatives; and Penicillins  Family History  Problem Relation Age of Onset  . Diabetes Mother   . Cancer Neg Hx   . Alcohol abuse Neg Hx   . Drug abuse Neg Hx   . Early death Neg Hx   . Hearing loss Neg Hx   . Heart disease Neg Hx   . Hyperlipidemia Neg Hx   . Hypertension Neg Hx   . Kidney disease Neg Hx   . Stroke Neg Hx   . Colon cancer Paternal Grandfather     died of colon cancer at age 73    Social History Social History  Substance Use Topics  . Smoking status: Former Smoker -- 1.00 packs/day for 20 years    Types: Cigarettes    Quit date: 08/22/2001  . Smokeless tobacco: Never Used  . Alcohol Use: No    Review of Systems Constitutional: No fever/chills Cardiovascular: Denies chest pain. Respiratory: Denies shortness of breath. Gastrointestinal:   No nausea, no vomiting.  Musculoskeletal: Positive for minimal left hand pain Skin: Positive for laceration Neurological: Negative for headaches, focal weakness or numbness.  10-point ROS otherwise negative.  ____________________________________________   PHYSICAL EXAM:  VITAL SIGNS: ED Triage Vitals  Enc Vitals Group     BP 01/13/16 1116 168/100 mmHg     Pulse Rate 01/13/16  1116 68     Resp 01/13/16 1116 20     Temp 01/13/16 1116 98.4 F (36.9 C)     Temp Source 01/13/16 1116 Oral     SpO2 01/13/16 1116 97 %     Weight 01/13/16 1116 185 lb (83.915 kg)     Height 01/13/16 1116 5\' 11"  (1.803 m)     Head Cir --      Peak Flow --      Pain Score 01/13/16 1123 2     Pain Loc --      Pain Edu? --      Excl. in [place]? --     Constitutional: Alert and oriented. Well appearing and in no acute distress. Eyes: Conjunctivae are normal. PERRL. EOMI. Head: Atraumatic. Nose: No congestion/rhinnorhea. Mouth/Throat: Mucous membranes are moist.  Oropharynx non-erythematous. Neck: No stridor.   Cardiovascular: Normal rate, regular rhythm. Grossly normal heart sounds.  Good  peripheral circulation. Respiratory: Normal respiratory effort.  No retractions. Lungs CTAB. Gastrointestinal: Soft and nontender. No distention.  Musculoskeletal: Examination of left hand there is a laceration on the dorsum of left hand extending from the lateral aspect to mid left hand. There is no active bleeding at this time. Patient is able to move all digits without any difficulty in flexion and extension. No foreign body was noted. Area was examined in a blood free field. There is a laceration of the fifth extensor tendon and partial laceration of the fourth extensor tendon. Patient is able to flex and extend without any difficulty or restriction. Capillary refill is less than 3 seconds. Motor sensory function intact. Neurologic:  Normal speech and language. No gross focal neurologic deficits are appreciated. No gait instability. Skin:  Skin is warm, dry. Positive for laceration left hand. Psychiatric: Mood and affect are normal. Speech and behavior are normal.  ____________________________________________   LABS (all labs ordered are listed, but only abnormal results are displayed)  Labs Reviewed - No data to display RADIOLOGY  Left hand per radiologist is negative for acute bony abnormality. I, [name], personally viewed and evaluated these images (plain radiographs) as part of my medical decision making, as well as reviewing the written report by the radiologist. ____________________________________________   PROCEDURES  Procedure(s) performed: LACERATION REPAIR Performed by: [name] Authorized by: [name] Consent: Verbal consent obtained. Risks and benefits: risks, benefits and alternatives were discussed Consent given by: patient Patient identity confirmed: provided demographic data Prepped and Draped in normal sterile fashion Wound explored  Laceration Location: Dorsum left hand  Laceration Length: 3.5 cm  No Foreign Bodies seen or  palpated  Anesthesia: local infiltration  Local anesthetic: lidocaine 1 % without epinephrine  Anesthetic total: 6 ml  Irrigation method: syringe Amount of cleaning: standard  Skin closure: 4-0 Ethilon   Number of sutures: 7   Technique: Simple interrupted   Patient tolerance: Patient tolerated the procedure well with no immediate complications.  Critical Care performed: No  ____________________________________________   INITIAL IMPRESSION / ASSESSMENT AND PLAN / ED COURSE  Pertinent labs & imaging results that were available during my care of the patient were reviewed by me and considered in my medical decision making (see chart for details).  Patient is right-hand dominant. Patient will follow up with a hand surgeon in [place] and the surgeon on call today was written down for the patient follow-up with. Patient's hand was soaked with normal saline and Betadine. Area was irrigated copiously with normal saline without foreign  body and without any continued bleeding area was examined in a bloodless field. Patient was made aware of the laceration to his tendon. He agrees to follow-up with the hand surgeon in [place] [facility] does not have a specialist. He is also placed on clindamycin for infection prevention along with Norco as needed for pain. Patient was discharged with a gutter splint to prevent movement of his fourth and fifth digits. Patient will call [facility] orthopedics today for a follow-up visit and further treatment. Patient was made aware of signs of infection and will return to the emergency room if any problems over the weekend. ____________________________________________   FINAL CLINICAL IMPRESSION(S) / ED DIAGNOSES  Final diagnoses:  Laceration of left hand involving tendon, initial encounter      NEW MEDICATIONS STARTED DURING THIS VISIT:  Discharge Medication List as of 01/13/2016  2:23 PM    START taking these medications   Details   clindamycin (CLEOCIN) 150 MG capsule Take 2 caps every 6 hours, Print    HYDROcodone-acetaminophen (NORCO/VICODIN) 5-325 MG tablet Take 1 tablet by mouth every 4 (four) hours as needed for moderate pain., Starting 01/13/2016, Until Discontinued, Print         Note:  This document was prepared using Dragon voice recognition software and may include unintentional dictation errors.    [name], PA-C 01/13/16 1515  [name], PA-C 01/13/16 1516  Joanne Gavel, MD 01/13/16 [phone]

## 2016-01-13 NOTE — Discharge Instructions (Signed)
Laceration Care, Adult  A laceration is a cut that goes through all layers of the skin. The cut also goes into the tissue that is right under the skin. Some cuts heal on their own. Others need to be closed with stitches (sutures), staples, skin adhesive strips, or wound glue. Taking care of your cut lowers your risk of infection and helps your cut to heal better.  HOW TO TAKE CARE OF YOUR CUT  For stitches or staples:  · Keep the wound clean and dry.  · If you were given a bandage (dressing), you should change it at least one time per day or as told by your doctor. You should also change it if it gets wet or dirty.  · Keep the wound completely dry for the first 24 hours or as told by your doctor. After that time, you may take a shower or a bath. However, make sure that the wound is not soaked in water until after the stitches or staples have been removed.  · Clean the wound one time each day or as told by your doctor:    Wash the wound with soap and water.    Rinse the wound with water until all of the soap comes off.    Pat the wound dry with a clean towel. Do not rub the wound.  · After you clean the wound, put a thin layer of antibiotic ointment on it as told by your doctor. This ointment:    Helps to prevent infection.    Keeps the bandage from sticking to the wound.  · Have your stitches or staples removed as told by your doctor.  If your doctor used skin adhesive strips:   · Keep the wound clean and dry.  · If you were given a bandage, you should change it at least one time per day or as told by your doctor. You should also change it if it gets dirty or wet.  · Do not get the skin adhesive strips wet. You can take a shower or a bath, but be careful to keep the wound dry.  · If the wound gets wet, pat it dry with a clean towel. Do not rub the wound.  · Skin adhesive strips fall off on their own. You can trim the strips as the wound heals. Do not remove any strips that are still stuck to the wound. They will  fall off after a while.  If your doctor used wound glue:  · Try to keep your wound dry, but you may briefly wet it in the shower or bath. Do not soak the wound in water, such as by swimming.  · After you take a shower or a bath, gently pat the wound dry with a clean towel. Do not rub the wound.  · Do not do any activities that will make you really sweaty until the skin glue has fallen off on its own.  · Do not apply liquid, cream, or ointment medicine to your wound while the skin glue is still on.  · If you were given a bandage, you should change it at least one time per day or as told by your doctor. You should also change it if it gets dirty or wet.  · If a bandage is placed over the wound, do not let the tape for the bandage touch the skin glue.  · Do not pick at the glue. The skin glue usually stays on for 5-10 days. Then, it   or when wound glue stays in place and the wound is healed. Make sure to wear a sunscreen of at least 30 SPF.  Take over-the-counter and prescription medicines only as told by your doctor.  If you were given antibiotic medicine or ointment, take or apply it as told by your doctor. Do not stop using the antibiotic even if your wound is getting better.  Do not scratch or pick at the wound.  Keep all follow-up visits as told by your doctor. This is important.  Check your wound every day for signs of infection. Watch for:  Redness, swelling, or pain.  Fluid, blood, or pus.  Raise (elevate) the injured area above the level of your heart while you are sitting or lying down, if possible. GET HELP IF:  You got a tetanus shot and you have any of these problems at the injection site:  Swelling.  Very bad pain.  Redness.  Bleeding.  You have a fever.  A wound that was  closed breaks open.  You notice a bad smell coming from your wound or your bandage.  You notice something coming out of the wound, such as wood or glass.  Medicine does not help your pain.  You have more redness, swelling, or pain at the site of your wound.  You have fluid, blood, or pus coming from your wound.  You notice a change in the color of your skin near your wound.  You need to change the bandage often because fluid, blood, or pus is coming from the wound.  You start to have a new rash.  You start to have numbness around the wound. GET HELP RIGHT AWAY IF:  You have very bad swelling around the wound.  Your pain suddenly gets worse and is very bad.  You notice painful lumps near the wound or on skin that is anywhere on your body.  You have a red streak going away from your wound.  The wound is on your hand or foot and you cannot move a finger or toe like you usually can.  The wound is on your hand or foot and you notice that your fingers or toes look pale or bluish.   This information is not intended to replace advice given to you by your health care provider. Make sure you discuss any questions you have with your health care provider.   Document Released: 01/25/2008 Document Revised: 12/23/2014 Document Reviewed: 08/04/2014 Elsevier Interactive Patient Education 2016 [employer].   Call orthopedist in [place] for further evaluation and treatment of your hand injury. The reason for this is that you do have a tendon injury. Leave splint on until seen by the orthopedist. Call today for an appointment. Begin taking clindamycin for infection prevention. Norco as needed for pain. Elevate if needed for pain and swelling.  Return to the emergency room if any severe worsening prior to your orthopedic appointment if needed.

## 2016-01-13 NOTE — ED Notes (Signed)
Pt presents from [facility] with laceration to left hand. Pt cut hand with chop saw. Bleeding controlled at this time. Pt alert & oriented with little pain and NAD noted.

## 2016-01-14 ENCOUNTER — Encounter (HOSPITAL_COMMUNITY)
Admission: AD | Disposition: A | Discharge status: Home / Self Care | Payer: Self-pay | Source: Ambulatory Visit | Attending: Orthopedic Surgery

## 2016-01-14 ENCOUNTER — Encounter (HOSPITAL_COMMUNITY): Payer: Self-pay | Admitting: *Deleted

## 2016-01-14 ENCOUNTER — Ambulatory Visit (HOSPITAL_COMMUNITY)
Admission: AD | Admit: 2016-01-14 | Discharge: 2016-01-14 | Disposition: A | Discharge status: Home / Self Care | Payer: BLUE CROSS/BLUE SHIELD | Source: Ambulatory Visit | Attending: Orthopedic Surgery | Admitting: Orthopedic Surgery

## 2016-01-14 ENCOUNTER — Ambulatory Visit (HOSPITAL_COMMUNITY): Payer: BLUE CROSS/BLUE SHIELD | Admitting: Certified Registered Nurse Anesthetist

## 2016-01-14 DIAGNOSIS — S66327A Laceration of extensor muscle, fascia and tendon of left little finger at wrist and hand level, initial encounter: Secondary | ICD-10-CM | POA: Insufficient documentation

## 2016-01-14 DIAGNOSIS — Z79899 Other long term (current) drug therapy: Secondary | ICD-10-CM | POA: Diagnosis not present

## 2016-01-14 DIAGNOSIS — I10 Essential (primary) hypertension: Secondary | ICD-10-CM | POA: Insufficient documentation

## 2016-01-14 DIAGNOSIS — Z87891 Personal history of nicotine dependence: Secondary | ICD-10-CM | POA: Diagnosis not present

## 2016-01-14 DIAGNOSIS — W278XXA Contact with other nonpowered hand tool, initial encounter: Secondary | ICD-10-CM | POA: Insufficient documentation

## 2016-01-14 DIAGNOSIS — S61412A Laceration without foreign body of left hand, initial encounter: Secondary | ICD-10-CM | POA: Diagnosis not present

## 2016-01-14 HISTORY — DX: Dermatitis, unspecified: L30.9

## 2016-01-14 HISTORY — DX: Malignant (primary) neoplasm, unspecified: C80.1

## 2016-01-14 HISTORY — PX: TENDON REPAIR: SHX5111

## 2016-01-14 LAB — BASIC METABOLIC PANEL
Anion gap: 7 (ref 5–15)
BUN: 16 mg/dL (ref 6–20)
CALCIUM: 8.9 mg/dL (ref 8.9–10.3)
CO2: 24 mmol/L (ref 22–32)
CREATININE: 0.78 mg/dL (ref 0.61–1.24)
Chloride: 108 mmol/L (ref 101–111)
GFR calc non Af Amer: >60 mL/min (ref >60)
Glucose, Bld: 95 mg/dL (ref 65–99)
Potassium: 3.9 mmol/L (ref 3.5–5.1)
SODIUM: 139 mmol/L (ref 135–145)

## 2016-01-14 LAB — CBC
HCT: 40.1 % (ref 39.0–52.0)
Hemoglobin: 14 g/dL (ref 13.0–17.0)
MCH: 30.8 pg (ref 26.0–34.0)
MCHC: 34.9 g/dL (ref 30.0–36.0)
MCV: 88.3 fL (ref 78.0–100.0)
PLATELETS: 186 10*3/uL (ref 150–400)
RBC: 4.54 MIL/uL (ref 4.22–5.81)
RDW: 13.1 % (ref 11.5–15.5)
WBC: 6.3 10*3/uL (ref 4.0–10.5)

## 2016-01-14 SURGERY — TENDON REPAIR
Anesthesia: General | Laterality: Left

## 2016-01-14 MED ORDER — CLINDAMYCIN PHOSPHATE 900 MG/50ML IV SOLN
900.0000 mg | INTRAVENOUS | Status: AC
  Administered 2016-01-14: 900 mg via INTRAVENOUS
  Filled 2016-01-14: qty 50

## 2016-01-14 MED ORDER — HYDROMORPHONE HCL 1 MG/ML IJ SOLN: 0.2500 mg | INTRAMUSCULAR | Status: DC | PRN

## 2016-01-14 MED ORDER — ONDANSETRON HCL 4 MG/2ML IJ SOLN
INTRAMUSCULAR | Status: DC | PRN
  Administered 2016-01-14: 4 mg via INTRAVENOUS

## 2016-01-14 MED ORDER — LACTATED RINGERS IV SOLN
INTRAVENOUS | Status: DC
  Administered 2016-01-14: 17:00:00 via INTRAVENOUS

## 2016-01-14 MED ORDER — KETOROLAC TROMETHAMINE 30 MG/ML IJ SOLN: 30.0000 mg | Freq: Once | INTRAMUSCULAR | Status: DC | PRN

## 2016-01-14 MED ORDER — PROPOFOL 10 MG/ML IV BOLUS
INTRAVENOUS | Status: DC | PRN
  Administered 2016-01-14: 50 mg via INTRAVENOUS
  Administered 2016-01-14: 150 mg via INTRAVENOUS

## 2016-01-14 MED ORDER — MIDAZOLAM HCL 2 MG/2ML IJ SOLN
INTRAMUSCULAR | Status: DC | PRN
  Administered 2016-01-14: 2 mg via INTRAVENOUS

## 2016-01-14 MED ORDER — PHENYLEPHRINE HCL 10 MG/ML IJ SOLN
INTRAMUSCULAR | Status: DC | PRN
  Administered 2016-01-14 (×2): 40 ug via INTRAVENOUS

## 2016-01-14 MED ORDER — MIDAZOLAM HCL 2 MG/2ML IJ SOLN
INTRAMUSCULAR | Status: AC
  Filled 2016-01-14: qty 2

## 2016-01-14 MED ORDER — PROPOFOL 10 MG/ML IV BOLUS
INTRAVENOUS | Status: AC
  Filled 2016-01-14: qty 20

## 2016-01-14 MED ORDER — 0.9 % SODIUM CHLORIDE (POUR BTL) OPTIME
TOPICAL | Status: DC | PRN
  Administered 2016-01-14: 1000 mL

## 2016-01-14 MED ORDER — BUPIVACAINE-EPINEPHRINE (PF) 0.25% -1:200000 IJ SOLN
INTRAMUSCULAR | Status: AC
  Filled 2016-01-14: qty 30

## 2016-01-14 MED ORDER — CHLORHEXIDINE GLUCONATE 4 % EX LIQD: 60.0000 mL | Freq: Once | CUTANEOUS | Status: DC

## 2016-01-14 MED ORDER — BUPIVACAINE HCL (PF) 0.25 % IJ SOLN
INTRAMUSCULAR | Status: DC | PRN
  Administered 2016-01-14: 10 mL

## 2016-01-14 MED ORDER — LIDOCAINE HCL (CARDIAC) 20 MG/ML IV SOLN
INTRAVENOUS | Status: DC | PRN
  Administered 2016-01-14: 100 mg via INTRAVENOUS

## 2016-01-14 MED ORDER — FENTANYL CITRATE (PF) 250 MCG/5ML IJ SOLN
INTRAMUSCULAR | Status: AC
  Filled 2016-01-14: qty 5

## 2016-01-14 MED ORDER — FENTANYL CITRATE (PF) 250 MCG/5ML IJ SOLN
INTRAMUSCULAR | Status: DC | PRN
  Administered 2016-01-14 (×3): 50 ug via INTRAVENOUS

## 2016-01-14 SURGICAL SUPPLY — 52 items
BANDAGE ACE 3X5.8 VEL STRL LF (GAUZE/BANDAGES/DRESSINGS) ×3 IMPLANT
BANDAGE ACE 4X5 VEL STRL LF (GAUZE/BANDAGES/DRESSINGS) ×3 IMPLANT
BANDAGE ELASTIC 3 VELCRO ST LF (GAUZE/BANDAGES/DRESSINGS) IMPLANT
BANDAGE ELASTIC 4 VELCRO ST LF (GAUZE/BANDAGES/DRESSINGS) IMPLANT
BNDG COHESIVE 1X5 TAN STRL LF (GAUZE/BANDAGES/DRESSINGS) IMPLANT
BNDG ESMARK 4X9 LF (GAUZE/BANDAGES/DRESSINGS) ×3 IMPLANT
BNDG GAUZE ELAST 4 BULKY (GAUZE/BANDAGES/DRESSINGS) ×6 IMPLANT
CORDS BIPOLAR (ELECTRODE) ×3 IMPLANT
COVER SURGICAL LIGHT HANDLE (MISCELLANEOUS) ×3 IMPLANT
CUFF TOURNIQUET SINGLE 18IN (TOURNIQUET CUFF) ×3 IMPLANT
CUFF TOURNIQUET SINGLE 24IN (TOURNIQUET CUFF) IMPLANT
DRAPE SURG 17X23 STRL (DRAPES) ×3 IMPLANT
DRSG ADAPTIC 3X8 NADH LF (GAUZE/BANDAGES/DRESSINGS) IMPLANT
GAUZE SPONGE 2X2 8PLY STRL LF (GAUZE/BANDAGES/DRESSINGS) IMPLANT
GAUZE SPONGE 4X4 12PLY STRL (GAUZE/BANDAGES/DRESSINGS) IMPLANT
GAUZE XEROFORM 1X8 LF (GAUZE/BANDAGES/DRESSINGS) ×3 IMPLANT
GLOVE BIOGEL PI IND STRL 8.5 (GLOVE) ×1 IMPLANT
GLOVE BIOGEL PI INDICATOR 8.5 (GLOVE) ×2
GLOVE SURG ORTHO 8.0 STRL STRW (GLOVE) ×3 IMPLANT
GOWN STRL REUS W/ TWL LRG LVL3 (GOWN DISPOSABLE) ×2 IMPLANT
GOWN STRL REUS W/ TWL XL LVL3 (GOWN DISPOSABLE) ×1 IMPLANT
GOWN STRL REUS W/TWL LRG LVL3 (GOWN DISPOSABLE) ×4
GOWN STRL REUS W/TWL XL LVL3 (GOWN DISPOSABLE) ×2
KIT BASIN OR (CUSTOM PROCEDURE TRAY) ×3 IMPLANT
KIT ROOM TURNOVER OR (KITS) ×3 IMPLANT
MANIFOLD NEPTUNE II (INSTRUMENTS) IMPLANT
NEEDLE HYPO 25GX1X1/2 BEV (NEEDLE) ×3 IMPLANT
NS IRRIG 1000ML POUR BTL (IV SOLUTION) ×3 IMPLANT
PACK ORTHO EXTREMITY (CUSTOM PROCEDURE TRAY) ×3 IMPLANT
PAD ARMBOARD 7.5X6 YLW CONV (MISCELLANEOUS) ×6 IMPLANT
PAD CAST 4YDX4 CTTN HI CHSV (CAST SUPPLIES) IMPLANT
PADDING CAST COTTON 4X4 STRL (CAST SUPPLIES)
SOAP 2 % CHG 4 OZ (WOUND CARE) ×3 IMPLANT
SPECIMEN JAR SMALL (MISCELLANEOUS) IMPLANT
SPLINT FIBERGLASS 3X35 (CAST SUPPLIES) ×3 IMPLANT
SPONGE GAUZE 2X2 STER 10/PKG (GAUZE/BANDAGES/DRESSINGS)
SPONGE GAUZE 4X4 12PLY STER LF (GAUZE/BANDAGES/DRESSINGS) ×3 IMPLANT
SUCTION FRAZIER HANDLE 10FR (MISCELLANEOUS)
SUCTION TUBE FRAZIER 10FR DISP (MISCELLANEOUS) IMPLANT
SUT FIBERWIRE 4-0 18 TAPR NDL (SUTURE) ×6
SUT MERSILENE 4 0 P 3 (SUTURE) IMPLANT
SUT PROLENE 4 0 PS 2 18 (SUTURE) ×3 IMPLANT
SUT VIC AB 2-0 CT1 27 (SUTURE)
SUT VIC AB 2-0 CT1 TAPERPNT 27 (SUTURE) IMPLANT
SUTURE FIBERWR 4-0 18 TAPR NDL (SUTURE) ×2 IMPLANT
SYR CONTROL 10ML LL (SYRINGE) ×3 IMPLANT
TOWEL OR 17X24 6PK STRL BLUE (TOWEL DISPOSABLE) ×3 IMPLANT
TOWEL OR 17X26 10 PK STRL BLUE (TOWEL DISPOSABLE) ×3 IMPLANT
TUBE CONNECTING 12'X1/4 (SUCTIONS) ×1
TUBE CONNECTING 12X1/4 (SUCTIONS) ×2 IMPLANT
UNDERPAD 30X30 INCONTINENT (UNDERPADS AND DIAPERS) ×3 IMPLANT
WATER STERILE IRR 1000ML POUR (IV SOLUTION) IMPLANT

## 2016-01-14 NOTE — Anesthesia Preprocedure Evaluation (Signed)
Anesthesia Evaluation  Patient identified by MRN, date of birth, ID band Patient awake    Reviewed: Allergy & Precautions, NPO status , Patient's Chart, lab work & pertinent test results  Airway Mallampati: II  TM Distance: >3 FB Neck ROM: Full    Dental  (+) Dental Advisory Given   Pulmonary asthma , sleep apnea , former smoker,    breath sounds clear to auscultation       Cardiovascular hypertension, Pt. on medications  Rhythm:Regular Rate:Normal     Neuro/Psych negative neurological ROS     GI/Hepatic Neg liver ROS, GERD  ,  Endo/Other  negative endocrine ROS  Renal/GU negative Renal ROS     Musculoskeletal   Abdominal   Peds  Hematology negative hematology ROS (+)   Anesthesia Other Findings   Reproductive/Obstetrics                             Lab Results  Component Value Date   WBC 6.3 01/14/2016   HGB 14.0 01/14/2016   HCT 40.1 01/14/2016   MCV 88.3 01/14/2016   PLT 186 01/14/2016   Lab Results  Component Value Date   CREATININE 0.78 01/14/2016   BUN 16 01/14/2016   NA 139 01/14/2016   K 3.9 01/14/2016   CL 108 01/14/2016   CO2 24 01/14/2016    Anesthesia Physical Anesthesia Plan  ASA: II  Anesthesia Plan: General   Post-op Pain Management:    Induction: Intravenous  Airway Management Planned: LMA  Additional Equipment:   Intra-op Plan:   Post-operative Plan: Extubation in OR  Informed Consent: I have reviewed the patients History and Physical, chart, labs and discussed the procedure including the risks, benefits and alternatives for the proposed anesthesia with the patient or authorized representative who has indicated his/her understanding and acceptance.     Plan Discussed with: CRNA  Anesthesia Plan Comments:         Anesthesia Quick Evaluation

## 2016-01-14 NOTE — Progress Notes (Signed)
Orthopedic Tech Progress Note Patient Details:  Matthew Richardson 1961/07/06 [id number]  Ortho Devices Type of Ortho Device: Arm sling Ortho Device/Splint Location: LUE Ortho Device/Splint Interventions: Ordered, Application   [name] 01/14/2016, 7:59 PM

## 2016-01-14 NOTE — Transfer of Care (Signed)
Immediate Anesthesia Transfer of Care Note  Patient: Matthew Richardson  Procedure(s) Performed: Procedure(s): LEFT HAND WOUND EXPLORATION WITH TENDON REPAIR (Left)  Patient Location: PACU  Anesthesia Type:General  Level of Consciousness: awake, oriented and patient cooperative  Airway & Oxygen Therapy: Patient Spontanous Breathing and Patient connected to nasal cannula oxygen  Post-op Assessment: Report given to RN and Post -op Vital signs reviewed and stable  Post vital signs: Reviewed and stable  Last Vitals:  Filed Vitals:   01/14/16 1622  BP: 130/77  Pulse: 50  Temp: 37 C  Resp: 20    Last Pain: There were no vitals filed for this visit.       Complications: No apparent anesthesia complications

## 2016-01-14 NOTE — Brief Op Note (Signed)
01/14/2016  5:55 PM  PATIENT:  Otho Perl  55 y.o. male  PRE-OPERATIVE DIAGNOSIS:  LACERATION  POST-OPERATIVE DIAGNOSIS:  * No post-op diagnosis entered *  PROCEDURE:  Procedure(s): HAND WOUND EXPLORATION AND TENDON REPAIR (Left)  SURGEON:  Surgeon(s) and Role:    * Iran Planas, MD - Primary  PHYSICIAN ASSISTANT:   ASSISTANTS: none   ANESTHESIA:   general  EBL:     BLOOD ADMINISTERED:none  DRAINS: none   LOCAL MEDICATIONS USED:  MARCAINE     SPECIMEN:  No Specimen  DISPOSITION OF SPECIMEN:  N/A  COUNTS:  YES  TOURNIQUET:    DICTATION: .Other Dictation: Dictation Number [id number]  PLAN OF CARE: Discharge to home after PACU  PATIENT DISPOSITION:  PACU - hemodynamically stable.   Delay start of Pharmacological VTE agent (>24hrs) due to surgical blood loss or risk of bleeding: not applicable

## 2016-01-14 NOTE — Progress Notes (Signed)
Per patient and wife, already have antibiotics and pain medicine filled up. Per wife Dr. Caralyn Guile told her to do follow-up in a week.

## 2016-01-14 NOTE — Anesthesia Procedure Notes (Signed)
Procedure Name: LMA Insertion Date/Time: 01/14/2016 6:21 PM Performed by: Merdis Delay Pre-anesthesia Checklist: Patient identified, Timeout performed, Emergency Drugs available, Suction available and Patient being monitored Patient Re-evaluated:Patient Re-evaluated prior to inductionOxygen Delivery Method: Circle system utilized Preoxygenation: Pre-oxygenation with 100% oxygen Intubation Type: IV induction LMA: LMA inserted LMA Size: 4.0 Number of attempts: 1 Placement Confirmation: positive ETCO2,  CO2 detector and breath sounds checked- equal and bilateral Tube secured with: Tape Dental Injury: Teeth and Oropharynx as per pre-operative assessment

## 2016-01-14 NOTE — Progress Notes (Signed)
Orthopedic Tech Progress Note Patient Details:  Matthew Richardson [birth year] [id number]  Ortho Devices Type of Ortho Device: Arm sling Ortho Device/Splint Location: LUE Ortho Device/Splint Interventions: Ordered, Application   [name] 01/14/2016, 7:58 PM

## 2016-01-14 NOTE — H&P (Signed)
Matthew Richardson is an 55 y.o. male.   Chief Complaint: left hand laceration HPI: Matthew Richardson is a 55 y.o. male is here with laceration to his left hand. Patient states that he cut his hand on a chop saw.Pt seen/evaluated in office today. Pt here for surgery, No prior surgery to left hand.  Past Medical History  Diagnosis Date  . Anemia   . Asthma   . History of nephrolithiasis   . GERD (gastroesophageal reflux disease)   . Hypertension     "white coat syndrome"--denies hypertension  . Cancer ([place])     skin  . Eczema     Past Surgical History  Procedure Laterality Date  . Wisdom tooth extraction  1994    Family History  Problem Relation Age of Onset  . Diabetes Mother   . Cancer Neg Hx   . Alcohol abuse Neg Hx   . Drug abuse Neg Hx   . Early death Neg Hx   . Hearing loss Neg Hx   . Heart disease Neg Hx   . Hyperlipidemia Neg Hx   . Hypertension Neg Hx   . Kidney disease Neg Hx   . Stroke Neg Hx   . Colon cancer Paternal Grandfather     died of colon cancer at age 74   Social History:  reports that he quit smoking about 14 years ago. His smoking use included Cigarettes. He has a 20 pack-year smoking history. He has never used smokeless tobacco. He reports that he drinks alcohol. He reports that he does not use illicit drugs.  Allergies:  Allergies  Allergen Reactions  . Singulair [Montelukast Sodium] Nausea Only  . Sulfamethoxazole-Trimethoprim Nausea And Vomiting  . Sulfonamide Derivatives Nausea And Vomiting  . Neosporin [Neomycin-Bacitracin Zn-Polymyx] Rash and Other (See Comments)    LOOKS LIKE IT "BURNS" OFF THE TOP LAYER OF SKIN  . Penicillins Rash    Has patient had a PCN reaction causing immediate rash, facial/tongue/throat swelling, SOB or lightheadedness with hypotension: Yes Has patient had a PCN reaction causing severe rash involving mucus membranes or skin necrosis: No Has patient had a PCN reaction that required hospitalization No Has patient had a PCN  reaction occurring within the last 10 years: No If all of the above answers are "NO", then may proceed with Cephalosporin use.   Newell Coral [Bacitracin-Polymyxin B] Rash and Other (See Comments)    LOOKS LIKE IT "BURNS" OFF THE TOP LAYER OF SKIN    Medications Prior to Admission  Medication Sig Dispense Refill  . clindamycin (CLEOCIN) 150 MG capsule Take 2 caps every 6 hours (Patient taking differently: Take 300 mg by mouth every 6 (six) hours. Start date 01/13/16 to last 7 days) 56 capsule 0  . fexofenadine (ALLEGRA) 180 MG tablet Take 180 mg by mouth daily as needed for allergies.    [name] HYDROcodone-acetaminophen (NORCO/VICODIN) 5-325 MG tablet Take 1 tablet by mouth every 4 (four) hours as needed for moderate pain. 20 tablet 0  . azithromycin (ZITHROMAX) 250 MG tablet Take as directed 6 tablet 0  . hydrochlorothiazide (MICROZIDE) 12.5 MG capsule Take 1 capsule (12.5 mg total) by mouth daily. 30 capsule 0    Results for orders placed or performed during the hospital encounter of 01/14/16 (from the past 48 hour(s))  Basic metabolic panel     Status: None   Collection Time: 01/14/16  4:47 PM  Result Value Ref Range   Sodium 139 135 - 145 mmol/L   Potassium  3.9 3.5 - 5.1 mmol/L   Chloride 108 101 - 111 mmol/L   CO2 24 22 - 32 mmol/L   Glucose, Bld 95 65 - 99 mg/dL   BUN 16 6 - 20 mg/dL   Creatinine, Ser 0.78 0.61 - 1.24 mg/dL   Calcium 8.9 8.9 - 10.3 mg/dL   GFR calc non Af Amer >60 >60 mL/min   GFR calc Af Amer >60 >60 mL/min    Comment: (NOTE) The eGFR has been calculated using the CKD EPI equation. This calculation has not been validated in all clinical situations. eGFR's persistently <60 mL/min signify possible Chronic Kidney Disease.    Anion gap 7 5 - 15  CBC     Status: None   Collection Time: 01/14/16  4:47 PM  Result Value Ref Range   WBC 6.3 4.0 - 10.5 K/uL   RBC 4.54 4.22 - 5.81 MIL/uL   Hemoglobin 14.0 13.0 - 17.0 g/dL   HCT 40.1 39.0 - 52.0 %   MCV 88.3 78.0 -  100.0 fL   MCH 30.8 26.0 - 34.0 pg   MCHC 34.9 30.0 - 36.0 g/dL   RDW 13.1 11.5 - 15.5 %   Platelets 186 150 - 400 K/uL   Dg Hand Complete Left  01/13/2016  CLINICAL DATA:  Recent injury with Saw with laceration in the palm EXAM: LEFT HAND - COMPLETE 3+ VIEW COMPARISON:  None. FINDINGS: Soft tissue laceration is noted. No underlying bony injury is seen. No radiopaque foreign body is noted. IMPRESSION: Soft tissue injury without acute bony abnormality. Electronically Signed   By: Inez Catalina M.D.   On: 01/13/2016 12:54    ROSNO RECENT ILLNESSES OR HOSPITALIZATIONS  Blood pressure 130/77, pulse 50, temperature 98.6 F (37 C), temperature source Oral, resp. rate 20, height '5\' 11"'$  (1.803 m), weight 86.183 kg (190 lb), SpO2 98 %. Physical Exam  General Appearance:  Alert, cooperative, no distress, appears stated age  Head:  Normocephalic, without obvious abnormality, atraumatic  Eyes:  Pupils equal, conjunctiva/corneas clear,         Throat: Lips, mucosa, and tongue normal; teeth and gums normal  Neck: No visible masses     Lungs:   respirations unlabored  Chest Wall:  No tenderness or deformity  Heart:  Regular rate and rhythm,  Abdomen:   Soft, non-tender,         Extremities: LEFT HAND: LIMITED MOTION OF SMALL FINGER FINGERS WARM WELL PERFUSED OBLIQUE LACERATION OVER ZONE 6 OF DORSUM OF HAND ALONG SMALL FINGER METACARAPAL NO WOUNDS VOLARLY  Pulses: 2+ and symmetric  Skin: Skin color, texture, turgor normal, no rashes or lesions     Neurologic: Normal   Assessment/Plan LEFT HAND LACERATION WITH TENDON INVOLVEMENT  LEFT HAND WOUND EXPLORATION AND REPAIR AS INDICATED  R/B/A DISCUSSED WITH PT IN OFFICE.  PT VOICED UNDERSTANDING OF PLAN CONSENT SIGNED DAY OF SURGERY PT SEEN AND EXAMINED PRIOR TO OPERATIVE PROCEDURE/DAY OF SURGERY SITE MARKED. QUESTIONS ANSWERED WILL GO HOME FOLLOWING SURGERY  WE ARE PLANNING SURGERY FOR YOUR UPPER EXTREMITY. THE RISKS AND BENEFITS OF  SURGERY INCLUDE BUT NOT LIMITED TO BLEEDING INFECTION, DAMAGE TO NEARBY NERVES ARTERIES TENDONS, FAILURE OF SURGERY TO ACCOMPLISH ITS INTENDED GOALS, PERSISTENT SYMPTOMS AND NEED FOR FURTHER SURGICAL INTERVENTION. WITH THIS IN MIND WE WILL PROCEED. I HAVE DISCUSSED WITH THE PATIENT THE PRE AND POSTOPERATIVE REGIMEN AND THE DOS AND DON'TS. PT VOICED UNDERSTANDING AND INFORMED CONSENT SIGNED.  Linna Hoff 01/14/2016, 5:53 PM

## 2016-01-15 ENCOUNTER — Encounter (HOSPITAL_COMMUNITY): Payer: Self-pay | Admitting: Orthopedic Surgery

## 2016-01-15 NOTE — Op Note (Signed)
NAMEABSALOM, MACKLEY                 ACCOUNT NO.:  [id number]  MEDICAL RECORD NO.:  [id number]  LOCATION:  [facility]                         FACILITY:  [facility]  PHYSICIAN:  Linna Hoff IV, M.D.DATE OF BIRTH:  12/04/1960  DATE OF PROCEDURE:  01/14/2016 DATE OF DISCHARGE:  01/14/2016                              OPERATIVE REPORT   PREOPERATIVE DIAGNOSIS:  Hand laceration with tendon involvement.  POSTOPERATIVE DIAGNOSIS:  Hand laceration with tendon involvement.  ATTENDING SURGEON:  Linna Hoff, M.D., who scrubbed and present for the entire procedure.  ASSISTANT SURGEON:  None.  ANESTHESIA:  General via LMA.  SURGICAL PROCEDURE: 1. Left hand EDC to the small finger tendon repair. 2. Left small finger EDQ tendon repair dorsum of the hand. 3. Traumatic laceration repair, 5 cm.  SURGICAL INDICATIONS:  Mr. Bruney is a right-hand-dominant gentleman, who sustained a penetrating injury to the dorsal aspect of the left hand. The patient was seen and evaluated in the office and recommended to undergo the above procedure.  Risks, benefits, and alternatives were discussed in detail with the patient.  Signed informed consent was obtained.  Risks include, but not limited to bleeding; infection; damage to nearby nerves, arteries, or tendons; loss of motion of wrist and digits; incomplete relief of symptoms; need for further surgical intervention.  DESCRIPTION OF PROCEDURE:  The patient was properly identified in the preoperative holding area, marked with permanent marker was made on the left hand to indicate the correct operative site.  The patient was then brought back to the operating room and supine on anesthesia room table, where general anesthesia was administered.  The patient tolerated this well.  A well-padded tourniquet was on left brachium, sealed with 1000 drape.  Left upper extremity was then prepped and draped in normal sterile fashion.  Time-out was called, correct side was  identified, and procedure then begun.  Attention was then turned to the left hand.  A traumatic 5 cm laceration was then opened up.  The wound was then thoroughly irrigated.  Copious wound irrigation done.  Following this, the EDC to the small finger was then examined and found that the patient has a near 90% laceration of the EDC.  Once this was done, __________ the patient was noted to have a complete laceration of the EDQ.  The tendons were then carefully mobilized and then repaired with several figure-of-eight horizontal mattress of FiberWire sutures.  Wounds were irrigated.  I did repair both tendons of the dorsum of the hand __________ the skin with traumatic laceration was then repaired with Prolene sutures.  Xeroform dressing, sterile compressive bandage, 10 mL of 0.25% of Marcaine infiltrated locally.  The patient was then placed in a well-padded dorsal volar splint, extubated, and taken to the recovery room in good condition.  POSTPROCEDURE PLAN:  The patient will be discharged home, seen back in the office in approximately 8 days for wound check, suture removal, and begin a postoperative __________ 6-week extensor tendon repair protocol.     Melrose Nakayama, M.D.     FWO/MEDQ  D:  01/14/2016  T:  01/14/2016  Job:  [id number]

## 2016-01-19 NOTE — Anesthesia Postprocedure Evaluation (Signed)
Anesthesia Post Note  Patient: Matthew Richardson  Procedure(s) Performed: Procedure(s) (LRB): LEFT HAND WOUND EXPLORATION WITH TENDON REPAIR (Left)  Patient location during evaluation: PACU Anesthesia Type: General Level of consciousness: awake and alert Pain management: pain level controlled Vital Signs Assessment: post-procedure vital signs reviewed and stable Respiratory status: spontaneous breathing, nonlabored ventilation, respiratory function stable and patient connected to nasal cannula oxygen Cardiovascular status: blood pressure returned to baseline and stable Postop Assessment: no signs of nausea or vomiting Anesthetic complications: no     Last Vitals:  Filed Vitals:   01/14/16 1930 01/14/16 1945  BP: 115/74 110/78  Pulse: 50 50  Temp:  36.4 C  Resp: 11 16    Last Pain:  Filed Vitals:   01/14/16 1947  PainSc: 0-No pain   Pain Goal:                 Catalina Gravel

## 2016-03-29 ENCOUNTER — Telehealth: Payer: Self-pay | Admitting: Internal Medicine

## 2016-03-29 MED ORDER — DOXYCYCLINE HYCLATE 100 MG PO TABS: 100.0000 mg | ORAL_TABLET | Freq: Two times a day (BID) | ORAL | 0 refills | Status: DC

## 2016-03-29 NOTE — Telephone Encounter (Signed)
Wife repeorts he has sinus infection, purulent and coughing Plan- doxycycline sent

## 2016-10-19 ENCOUNTER — Encounter: Payer: Self-pay | Admitting: Internal Medicine

## 2016-10-19 ENCOUNTER — Ambulatory Visit (INDEPENDENT_AMBULATORY_CARE_PROVIDER_SITE_OTHER): Payer: BLUE CROSS/BLUE SHIELD | Admitting: Internal Medicine

## 2016-10-19 VITALS — BP 122/70 | HR 74 | Ht 71.0 in | Wt 204.8 lb

## 2016-10-19 DIAGNOSIS — J4520 Mild intermittent asthma, uncomplicated: Secondary | ICD-10-CM

## 2016-10-19 DIAGNOSIS — G4733 Obstructive sleep apnea (adult) (pediatric): Secondary | ICD-10-CM | POA: Diagnosis not present

## 2016-10-19 LAB — NITRIC OXIDE: Nitric Oxide: 15

## 2016-10-19 NOTE — Progress Notes (Signed)
Patient ID: Matthew Richardson, male    DOB: [birth year], 56 y.o.   MRN: [id number]  HPI Male former smoker followed for allergic rhinitis, EIA, OSA/quit CPAP, complicated by kidney stone NPSG 06/14/12- AHI 9.5 per hour, moderately loud snoring. Weight 190 pounds Office spirometry 10/19/16- WNL- FVC 4.46/ 87%, FEV1 3.29/ 84%, ratio 0.74, 25-75% 2.91/ 86% FENO 10/19/16- 15 (WNL)  ---------------------------------------------------------------------  06/25/12- 56 year old male former smoker, OSA Review sleep study and EKG with patient. Wife here. NPSG 06/14/12- AHI 9.5 per hour, moderately loud snoring. Weight 190 pounds. He has been under recent stress since loss of job. We discussed sleep hygiene and medical effects of sleep apnea. He continues to exercise regularly on a treadmill. Single-lead EKG recording on his sleep study had an even baseline but suggested abnormal elevation of ST segment. We asked him to come in for EKG and review of his study. He has no history of cardiac disease and denies chest pain or palpitation. EKG 06/25/12- nonspecific ST segment changes. Probably normal. EKG will be filed in the system as a baseline.  10/19/2016- 14 yoM fomer smoker followed for allergic rhinitis, EIAsthma, OSA/quit CPAP, complicated by kidney stone, Hep B FOLLOWS FOR: Pt states he needs something to help with SOB and wheezing-worse with activity. Known allergy to mold and ragweed. More persistent chest tightness with slight wheeze in the last 2 months. Did some painting without a mask. Still runs regularly but more shortness of breath and chest tightness running in really cold air. Has used wife's inhaler with definite help. Office spirometry 10/19/16- WNL- FVC 4.46/ 87%, FEV1 3.29/ 84%, ratio 0.74, 25-75% 2.91/ 86% FENO 10/19/16- 15 (WNL)  Review of Systems-see HPI Constitutional:   No-   weight loss, night sweats, fevers, chills,+ fatigue, lassitude. HEENT:   No-  headaches, difficulty swallowing,  tooth/dental problems, sore throat,       No-  sneezing, itching, ear ache, +nasal congestion, post nasal drip,  CV:  No-   chest pain, orthopnea, PND, swelling in lower extremities, anasarca, dizziness, palpitations Resp: + shortness of breath with exertion or at rest.              No-   productive cough,  No non-productive cough,  No-  coughing up of blood.              No-   change in color of mucus.  + wheezing.   Skin: No-   rash or lesions. GI:  No-   heartburn, indigestion, abdominal pain, nausea, vomiting,  GU:  MS:  No-   joint pain or swelling.   Neuro- nothing unusual Psych:  No- change in mood or affect. No depression or anxiety.  No memory loss.     Objective:   Physical Exam General- Alert, Oriented, Affect-appropriate, Distress- none acute; very fit appearing-note sinus bradycardia. Trim Skin- rash-none, lesions- none, excoriation- none.  Lymphadenopathy- none Head- atraumatic            Eyes- Gross vision intact, PERRLA, conjunctivae clear secretions            Ears- Hearing, canals normal            Nose- Clear, no-Septal dev, mucus, polyps, erosion, perforation             Throat- Mallampati III-IV , mucosa clear , drainage- none, tonsils- atrophic Neck- flexible , trachea midline, no stridor , thyroid nl, carotid no bruit Chest - symmetrical excursion , unlabored  Heart/CV- RRR , no murmur , no gallop  , no rub, nl s1 s2                           - JVD- none , edema- none, stasis changes- none, varices- none           Lung- clear, wheeze- none, cough- none , dullness-none, rub- none           Chest wall-  Abd-  Br/ Gen/ Rectal- Not done, not indicated Extrem- cyanosis- none, clubbing, none, atrophy- none, strength- nl Neuro- grossly intact to observation

## 2016-10-19 NOTE — Patient Instructions (Signed)
Order- FENO   Dx allergic asthma mild intermittent  Order- Office spirometry  Script for albuterol rescue inhaler to use if needed -   Inhale 2 puffs every 4 to 6 hours if needed  Please call as needed

## 2016-10-19 NOTE — Assessment & Plan Note (Addendum)
He is clear today on exam and by spirometry with normal range FENO. We will let him have a rescue inhaler and watch to see if he needs a maintenance controller as well.

## 2016-10-19 NOTE — Assessment & Plan Note (Signed)
Inactive issue. He stopped using CPAP years ago, not finding it made his life any better. He keeps his weight down.

## 2016-11-14 ENCOUNTER — Telehealth: Payer: Self-pay | Admitting: Internal Medicine

## 2016-11-14 MED ORDER — ALBUTEROL SULFATE HFA 108 (90 BASE) MCG/ACT IN AERS: 2.0000 | INHALATION_SPRAY | Freq: Four times a day (QID) | RESPIRATORY_TRACT | 6 refills | Status: DC | PRN

## 2016-11-14 NOTE — Telephone Encounter (Signed)
Spoke with pt's wife, requesting albuterol to be sent to pharmacy.  This has been called in.  Nothing further needed.

## 2017-01-04 ENCOUNTER — Telehealth: Payer: Self-pay | Admitting: Internal Medicine

## 2017-01-04 MED ORDER — FLUTICASONE FUROATE-VILANTEROL 100-25 MCG/INH IN AEPB: 1.0000 | INHALATION_SPRAY | Freq: Every day | RESPIRATORY_TRACT | 12 refills | Status: DC

## 2017-01-04 NOTE — Telephone Encounter (Signed)
Wife reports he is needing daily rescue inhaler. We had previously anticipated he might need a maintenance controller Plan-     Breo 100 sent

## 2017-01-04 NOTE — Telephone Encounter (Signed)
Wife reports he's needing daily rescue inhaler. We had previously anticipated he might need maitenance co

## 2017-01-05 ENCOUNTER — Other Ambulatory Visit: Payer: Self-pay | Admitting: Internal Medicine

## 2017-01-05 MED ORDER — FLUTICASONE FUROATE-VILANTEROL 100-25 MCG/INH IN AEPB
1.0000 | INHALATION_SPRAY | Freq: Every day | RESPIRATORY_TRACT | 0 refills | Status: DC
Start: 2017-01-05 — End: 2017-02-20

## 2017-01-19 ENCOUNTER — Telehealth: Payer: Self-pay | Admitting: Internal Medicine

## 2017-01-19 MED ORDER — CEFDINIR 300 MG PO CAPS: 300.0000 mg | ORAL_CAPSULE | Freq: Two times a day (BID) | ORAL | 0 refills | Status: DC

## 2017-01-19 NOTE — Telephone Encounter (Signed)
Offer cefdinir 300 mg, # 20, 1 twice daily.   Ok to over-ride pcn allergy warning.

## 2017-01-19 NOTE — Telephone Encounter (Signed)
Spoke with pt's wife, aware of recs.  rx sent to preferred pharmacy.  nothing further needed.

## 2017-01-19 NOTE — Telephone Encounter (Signed)
Pt wife called and stated that the pt feels he has a sinus infection.  Green/yellow drainage and coughing up as well.  Sinus pressure and using allegra without any help. Pt stated that the zpak does not help with him.  Would like to have something called in please.  Thanks  Last ov--10/19/16 Next ov--10/25/17  Allergies  Allergen Reactions  . Singulair [Montelukast Sodium] Nausea Only  . Sulfamethoxazole-Trimethoprim Nausea And Vomiting  . Sulfonamide Derivatives Nausea And Vomiting  . Neosporin [Neomycin-Bacitracin Zn-Polymyx] Rash and Other (See Comments)    LOOKS LIKE IT "BURNS" OFF THE TOP LAYER OF SKIN  . Penicillins Rash    Has patient had a PCN reaction causing immediate rash, facial/tongue/throat swelling, SOB or lightheadedness with hypotension: Yes Has patient had a PCN reaction causing severe rash involving mucus membranes or skin necrosis: No Has patient had a PCN reaction that required hospitalization No Has patient had a PCN reaction occurring within the last 10 years: No If all of the above answers are "NO", then may proceed with Cephalosporin use.   Newell Coral [Bacitracin-Polymyxin B] Rash and Other (See Comments)    LOOKS LIKE IT "BURNS" OFF THE TOP LAYER OF SKIN

## 2017-02-20 ENCOUNTER — Other Ambulatory Visit: Payer: Self-pay | Admitting: Internal Medicine

## 2017-02-20 ENCOUNTER — Telehealth: Payer: Self-pay | Admitting: Internal Medicine

## 2017-02-20 MED ORDER — FLUTICASONE FUROATE-VILANTEROL 100-25 MCG/INH IN AEPB
1.0000 | INHALATION_SPRAY | Freq: Every day | RESPIRATORY_TRACT | 0 refills | Status: DC
Start: 2017-02-20 — End: 2017-02-20

## 2017-02-20 MED ORDER — BUDESONIDE-FORMOTEROL FUMARATE 160-4.5 MCG/ACT IN AERO: 2.0000 | INHALATION_SPRAY | Freq: Two times a day (BID) | RESPIRATORY_TRACT | 11 refills | Status: DC

## 2017-02-20 NOTE — Telephone Encounter (Signed)
Suggest change Breo to Symbicort 160, # 1, inhale 2 puffs, then rinse mouth, twice daily    Ref x 12

## 2017-02-20 NOTE — Telephone Encounter (Signed)
Spoke with pt's wife, Suanne Marker. She is aware of the medication change. Rx has been sent in. Nothing further was needed.

## 2017-02-20 NOTE — Telephone Encounter (Signed)
Attempted to initiate the PA for the [facility].  pts insurance does not cover this medication.  They will cover:  symbicort 80 or 160, dulera 100 or 200.  CY please advise if you want to  Change to one of these meds or continue with the PA.  Thanks

## 2017-04-04 ENCOUNTER — Telehealth: Payer: Self-pay | Admitting: Internal Medicine

## 2017-04-04 MED ORDER — FLUTICASONE-UMECLIDIN-VILANT 100-62.5-25 MCG/INH IN AEPB
1.0000 | INHALATION_SPRAY | Freq: Every day | RESPIRATORY_TRACT | 5 refills | Status: DC
Start: 2017-04-04 — End: 2017-11-29

## 2017-04-04 NOTE — Telephone Encounter (Signed)
Wife states patient is having to use his rescue inhaler to often even while using Symbicort 160/4.5 2 puffs BID.   CY Please advise on what inhaler to send to pharmacy. Thanks.

## 2017-04-04 NOTE — Telephone Encounter (Signed)
If covered, try Trelegy    # 1, inhale 1 puff, once daily, rinse mouth    Refill prn  If not covered, We can ADD Rx Spiriva  Respimat   # 1,  Inhale 2 puffs, once daily, refill prn, continue Symbicort

## 2017-04-04 NOTE — Telephone Encounter (Signed)
Error

## 2017-04-04 NOTE — Telephone Encounter (Signed)
Spoke with Rhonda-wife; she is aware of Rx for Trelegy sent to pharmacy. Nothing more needed at this time.

## 2017-05-27 IMAGING — DX DG HAND COMPLETE 3+V*L*
3 series · 3 of 3 positions shown · non-contrast
Comparison: None.

CLINICAL DATA: Recent injury with Saw with laceration in the palm

EXAM:
LEFT HAND - COMPLETE 3+ VIEW

[hand ap]
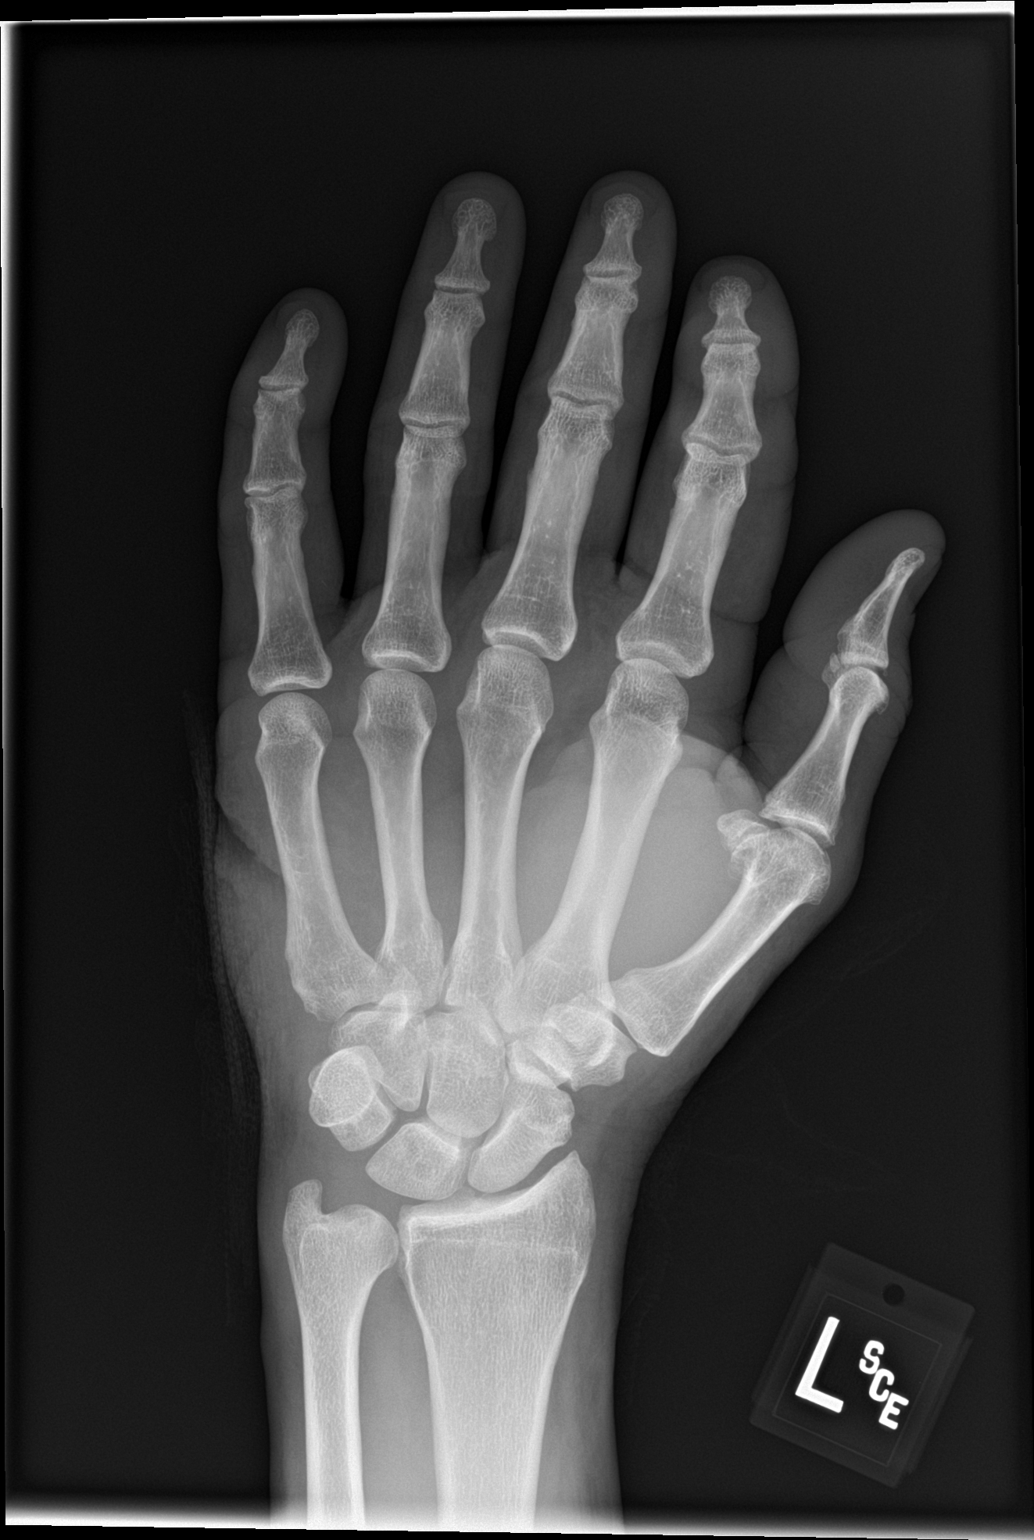

[hand obl]
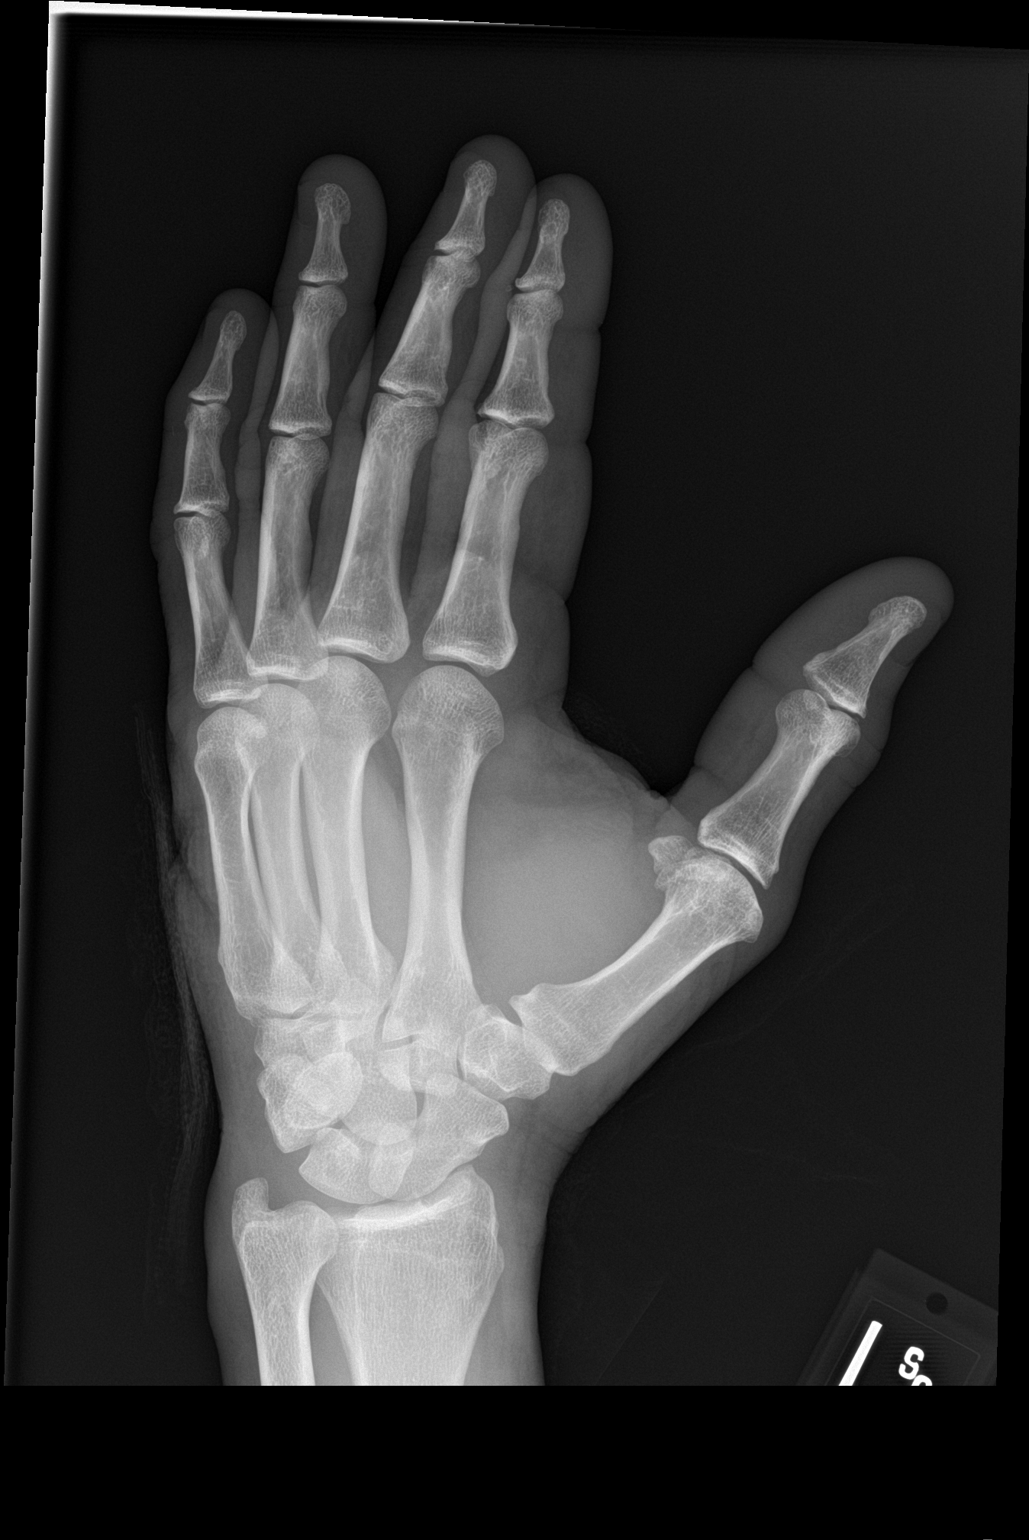

[hand lat]
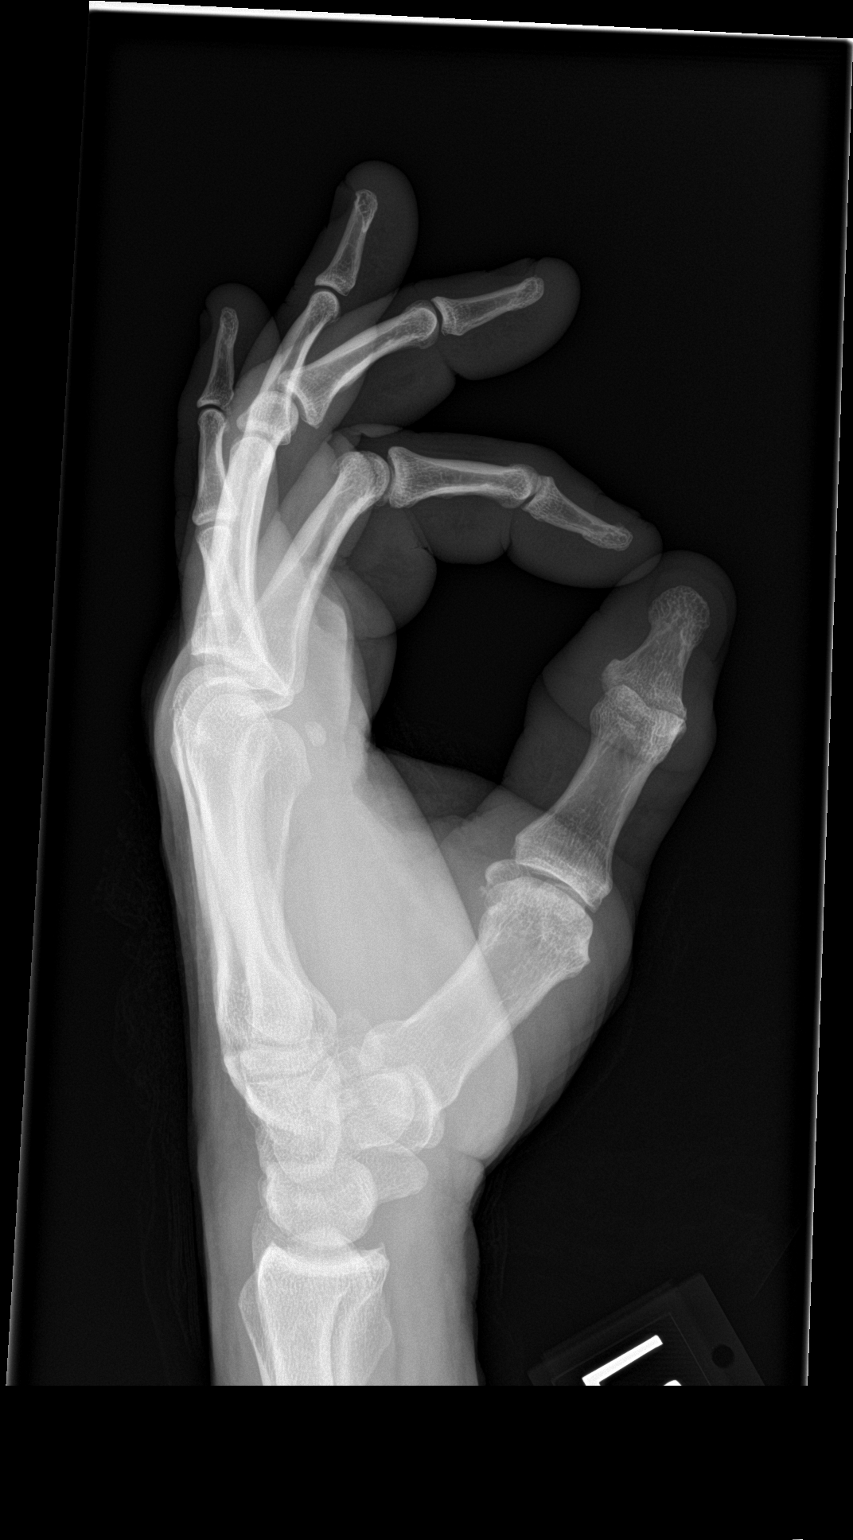

[3 of 3 positions shown; findings below may reference images not displayed]

FINDINGS: Soft tissue laceration is noted. No underlying bony injury is seen.
No radiopaque foreign body is noted.
IMPRESSION: Soft tissue injury without acute bony abnormality.

## 2017-06-21 ENCOUNTER — Telehealth: Payer: Self-pay | Admitting: Internal Medicine

## 2017-06-21 MED ORDER — DOXYCYCLINE HYCLATE 100 MG PO TABS: 100.0000 mg | ORAL_TABLET | Freq: Two times a day (BID) | ORAL | 0 refills | Status: DC

## 2017-06-21 NOTE — Telephone Encounter (Signed)
Offer doxycycline 100 mg, # 8, 2 today then one daily 

## 2017-06-21 NOTE — Telephone Encounter (Signed)
Advised wife CY message. Rx sent into [facility] in [place]. Nothing further is needed.

## 2017-06-21 NOTE — Telephone Encounter (Signed)
Spoke with the pt's spouse Suanne Marker She states pt started feeling bad 2 days ago, after doing some renovation work- pulling up old carpet  He is coughing up some green sputum and has minimal wheezing He has been using his albuterol inhaler some  No CP, f/c/s, or other co's He is going out of town and req something to be called in  [facility] has not worked in the past  Please advise thanks! Allergies  Allergen Reactions  . Singulair [Montelukast Sodium] Nausea Only  . Sulfamethoxazole-Trimethoprim Nausea And Vomiting  . Sulfonamide Derivatives Nausea And Vomiting  . Neosporin [Neomycin-Bacitracin Zn-Polymyx] Rash and Other (See Comments)    LOOKS LIKE IT "BURNS" OFF THE TOP LAYER OF SKIN  . Penicillins Rash    Has patient had a PCN reaction causing immediate rash, facial/tongue/throat swelling, SOB or lightheadedness with hypotension: Yes Has patient had a PCN reaction causing severe rash involving mucus membranes or skin necrosis: No Has patient had a PCN reaction that required hospitalization No Has patient had a PCN reaction occurring within the last 10 years: No If all of the above answers are "NO", then may proceed with Cephalosporin use.   Newell Coral [Bacitracin-Polymyxin B] Rash and Other (See Comments)    LOOKS LIKE IT "BURNS" OFF THE TOP LAYER OF SKIN   Current Outpatient Prescriptions on File Prior to Visit  Medication Sig Dispense Refill  . albuterol (PROVENTIL HFA;VENTOLIN HFA) 108 (90 Base) MCG/ACT inhaler Inhale 2 puffs into the lungs every 6 (six) hours as needed for wheezing or shortness of breath. 1 Inhaler 6  . budesonide-formoterol (SYMBICORT) 160-4.5 MCG/ACT inhaler Inhale 2 puffs into the lungs 2 (two) times daily. 1 Inhaler 11  . cefdinir (OMNICEF) 300 MG capsule Take 1 capsule (300 mg total) by mouth 2 (two) times daily. 20 capsule 0  . fexofenadine (ALLEGRA) 180 MG tablet Take 180 mg by mouth daily as needed for allergies.    . Fluticasone-Umeclidin-Vilant (TRELEGY  ELLIPTA) 100-62.5-25 MCG/INH AEPB Inhale 1 puff into the lungs daily. 1 each 5   No current facility-administered medications on file prior to visit.

## 2017-09-20 ENCOUNTER — Other Ambulatory Visit (INDEPENDENT_AMBULATORY_CARE_PROVIDER_SITE_OTHER): Payer: BLUE CROSS/BLUE SHIELD

## 2017-09-20 ENCOUNTER — Encounter: Payer: Self-pay | Admitting: Internal Medicine

## 2017-09-20 ENCOUNTER — Ambulatory Visit (INDEPENDENT_AMBULATORY_CARE_PROVIDER_SITE_OTHER): Payer: BLUE CROSS/BLUE SHIELD | Admitting: Internal Medicine

## 2017-09-20 VITALS — BP 130/70 | HR 55 | Temp 98.1°F | Resp 16 | Ht 71.0 in | Wt 212.1 lb

## 2017-09-20 DIAGNOSIS — Z0000 Encounter for general adult medical examination without abnormal findings: Secondary | ICD-10-CM

## 2017-09-20 DIAGNOSIS — B181 Chronic viral hepatitis B without delta-agent: Secondary | ICD-10-CM

## 2017-09-20 DIAGNOSIS — Z23 Encounter for immunization: Secondary | ICD-10-CM | POA: Insufficient documentation

## 2017-09-20 DIAGNOSIS — K219 Gastro-esophageal reflux disease without esophagitis: Secondary | ICD-10-CM | POA: Diagnosis not present

## 2017-09-20 DIAGNOSIS — N5201 Erectile dysfunction due to arterial insufficiency: Secondary | ICD-10-CM | POA: Diagnosis not present

## 2017-09-20 DIAGNOSIS — R7309 Other abnormal glucose: Secondary | ICD-10-CM | POA: Diagnosis not present

## 2017-09-20 DIAGNOSIS — Z0001 Encounter for general adult medical examination with abnormal findings: Secondary | ICD-10-CM

## 2017-09-20 LAB — TSH: TSH: 1.07 u[IU]/mL (ref 0.35–4.50)

## 2017-09-20 LAB — URINALYSIS, ROUTINE W REFLEX MICROSCOPIC
Bilirubin Urine: NEGATIVE
Ketones, ur: NEGATIVE
Leukocytes, UA: NEGATIVE
Nitrite: NEGATIVE
PH: 6 (ref 5.0–8.0)
Specific Gravity, Urine: 1.02 (ref 1.000–1.030)
TOTAL PROTEIN, URINE-UPE24: NEGATIVE
Urine Glucose: NEGATIVE
Urobilinogen, UA: 0.2 (ref 0.0–1.0)

## 2017-09-20 LAB — CBC WITH DIFFERENTIAL/PLATELET
Basophils Absolute: 0 10*3/uL (ref 0.0–0.1)
Basophils Relative: 0.6 % (ref 0.0–3.0)
EOS PCT: 3.6 % (ref 0.0–5.0)
Eosinophils Absolute: 0.2 10*3/uL (ref 0.0–0.7)
HCT: 42.5 % (ref 39.0–52.0)
Hemoglobin: 15.2 g/dL (ref 13.0–17.0)
LYMPHS ABS: 1.6 10*3/uL (ref 0.7–4.0)
Lymphocytes Relative: 25.1 % (ref 12.0–46.0)
MCHC: 35.8 g/dL (ref 30.0–36.0)
MCV: 87.5 fl (ref 78.0–100.0)
MONO ABS: 0.3 10*3/uL (ref 0.1–1.0)
Monocytes Relative: 5.1 % (ref 3.0–12.0)
NEUTROS PCT: 65.6 % (ref 43.0–77.0)
Neutro Abs: 4.2 10*3/uL (ref 1.4–7.7)
PLATELETS: 233 10*3/uL (ref 150.0–400.0)
RBC: 4.86 Mil/uL (ref 4.22–5.81)
RDW: 13.2 % (ref 11.5–15.5)
WBC: 6.4 10*3/uL (ref 4.0–10.5)

## 2017-09-20 LAB — LIPID PANEL
Cholesterol: 195 mg/dL (ref 0–200)
HDL: 72.9 mg/dL (ref >39.00)
LDL Cholesterol: 111 mg/dL — ABNORMAL HIGH (ref 0–99)
NonHDL: 121.82
Total CHOL/HDL Ratio: 3
Triglycerides: 54 mg/dL (ref 0.0–149.0)
VLDL: 10.8 mg/dL (ref 0.0–40.0)

## 2017-09-20 LAB — COMPREHENSIVE METABOLIC PANEL WITH GFR
ALT: 20 U/L (ref 0–53)
AST: 21 U/L (ref 0–37)
Albumin: 4.3 g/dL (ref 3.5–5.2)
Alkaline Phosphatase: 40 U/L (ref 39–117)
BUN: 29 mg/dL — ABNORMAL HIGH (ref 6–23)
CO2: 28 meq/L (ref 19–32)
Calcium: 8.6 mg/dL (ref 8.4–10.5)
Chloride: 106 meq/L (ref 96–112)
Creatinine, Ser: 0.94 mg/dL (ref 0.40–1.50)
GFR: 88.45 mL/min
Glucose, Bld: 128 mg/dL — ABNORMAL HIGH (ref 70–99)
Potassium: 4.6 meq/L (ref 3.5–5.1)
Sodium: 141 meq/L (ref 135–145)
Total Bilirubin: 0.5 mg/dL (ref 0.2–1.2)
Total Protein: 6.7 g/dL (ref 6.0–8.3)

## 2017-09-20 LAB — HEMOGLOBIN A1C: HEMOGLOBIN A1C: 5.6 % (ref 4.6–6.5)

## 2017-09-20 LAB — PSA: PSA: 1.79 ng/mL (ref 0.10–4.00)

## 2017-09-20 MED ORDER — ZOSTER VAC RECOMB ADJUVANTED 50 MCG/0.5ML IM SUSR: 0.5000 mL | Freq: Once | INTRAMUSCULAR | 1 refills | Status: AC

## 2017-09-20 MED ORDER — SILDENAFIL CITRATE 20 MG PO TABS: 80.0000 mg | ORAL_TABLET | Freq: Every day | ORAL | 11 refills | Status: DC | PRN

## 2017-09-20 MED ORDER — OMEPRAZOLE 40 MG PO CPDR: 40.0000 mg | DELAYED_RELEASE_CAPSULE | Freq: Every day | ORAL | 1 refills | Status: DC

## 2017-09-20 NOTE — Progress Notes (Signed)
Subjective:  Patient ID: Matthew Richardson, male    DOB: [birth year]  Age: 57 y.o. MRN: [id number]  CC: Annual Exam and Gastroesophageal Reflux   HPI ZACHERIAH STUMPE presents for a CPX.  He complains of ED with difficulty maintaining an erection and penetration.  He has never tried an agent for this before.  He also complains of heartburn.  He denies odynophagia or dysphagia.  He has tried an over-the-counter dose of Nexium with modest improvement in his symptoms.  He has complained about his blood sugar because he has frequent urination and crave sweets.  Is very active and runs long distances.  He denies any recent episodes of CP, DOE, shortness of breath, or fatigue.  History Shedrick has a past medical history of Anemia, Asthma, Cancer ([place]), Eczema, GERD (gastroesophageal reflux disease), History of nephrolithiasis, and Hypertension.   He has a past surgical history that includes Wisdom tooth extraction (1994) and Tendon repair (Left, 01/14/2016).   His family history includes Colon cancer in his paternal grandfather; Diabetes in his mother.He reports that he quit smoking about 16 years ago. His smoking use included cigarettes. He has a 20.00 pack-year smoking history. he has never used smokeless tobacco. He reports that he drinks alcohol. He reports that he does not use drugs.  Outpatient Medications Prior to Visit  Medication Sig Dispense Refill  . albuterol (PROVENTIL HFA;VENTOLIN HFA) 108 (90 Base) MCG/ACT inhaler Inhale 2 puffs into the lungs every 6 (six) hours as needed for wheezing or shortness of breath. 1 Inhaler 6  . Fluticasone-Umeclidin-Vilant (TRELEGY ELLIPTA) 100-62.5-25 MCG/INH AEPB Inhale 1 puff into the lungs daily. 1 each 5  . clobetasol ointment (TEMOVATE) 0.05 %     . budesonide-formoterol (SYMBICORT) 160-4.5 MCG/ACT inhaler Inhale 2 puffs into the lungs 2 (two) times daily. 1 Inhaler 11  . cefdinir (OMNICEF) 300 MG capsule Take 1 capsule (300 mg total) by mouth 2  (two) times daily. 20 capsule 0  . doxycycline (VIBRA-TABS) 100 MG tablet Take 1 tablet (100 mg total) by mouth 2 (two) times daily. 8 tablet 0  . fexofenadine (ALLEGRA) 180 MG tablet Take 180 mg by mouth daily as needed for allergies.     No facility-administered medications prior to visit.     ROS Review of Systems  Constitutional: Negative.  Negative for activity change, diaphoresis and fatigue.  HENT: Negative.   Eyes: Negative for visual disturbance.  Respiratory: Negative.  Negative for cough, chest tightness, shortness of breath and wheezing.   Cardiovascular: Negative for chest pain, palpitations and leg swelling.  Gastrointestinal: Negative.  Negative for abdominal pain, diarrhea and vomiting.  Endocrine: Positive for polyuria. Negative for polydipsia and polyphagia.  Genitourinary: Positive for frequency. Negative for difficulty urinating, dysuria, hematuria, penile swelling, scrotal swelling, testicular pain and urgency.  Musculoskeletal: Negative.   Skin: Negative.   Allergic/Immunologic: Negative.   Neurological: Negative.  Negative for dizziness, weakness and light-headedness.  Hematological: Negative for adenopathy. Does not bruise/bleed easily.  Psychiatric/Behavioral: Negative.     Objective:  BP 130/70 (BP Location: Left Arm, Patient Position: Sitting, Cuff Size: Normal)   Pulse (!) 55   Temp 98.1 F (36.7 C) (Oral)   Resp 16   Ht 5\' 11"  (1.803 m)   Wt 212 lb 1.3 oz (96.2 kg)   SpO2 97%   BMI 29.58 kg/m   Physical Exam  Constitutional: He is oriented to person, place, and time. No distress.  HENT:  Mouth/Throat: Oropharynx is clear and moist. No  oropharyngeal exudate.  Eyes: Conjunctivae are normal. Left eye exhibits no discharge. No scleral icterus.  Neck: Normal range of motion. Neck supple. No JVD present. No thyromegaly present.  Cardiovascular: Normal rate, regular rhythm and normal heart sounds. Exam reveals no gallop.  No murmur  heard. Pulmonary/Chest: Effort normal and breath sounds normal. No respiratory distress. He has no wheezes. He has no rales.  Abdominal: Soft. Bowel sounds are normal. He exhibits no distension and no mass. There is no tenderness. There is no guarding. Hernia confirmed negative in the right inguinal area and confirmed negative in the left inguinal area.  Genitourinary: Rectum normal, prostate normal, testes normal and penis normal. Rectal exam shows no external hemorrhoid, no internal hemorrhoid, no fissure, no mass, no tenderness and guaiac negative stool. Prostate is not enlarged and not tender. Right testis shows no mass, no swelling and no tenderness. Left testis shows no mass, no swelling and no tenderness. Circumcised. No penile erythema or penile tenderness. No discharge found.  Musculoskeletal: Normal range of motion. He exhibits no edema, tenderness or deformity.  Lymphadenopathy:    He has no cervical adenopathy.       Right: No inguinal adenopathy present.       Left: No inguinal adenopathy present.  Neurological: He is alert and oriented to person, place, and time.  Skin: Skin is warm and dry. No rash noted. He is not diaphoretic. No erythema. No pallor.  Psychiatric: He has a normal mood and affect. His behavior is normal. Judgment and thought content normal.  Vitals reviewed.   Lab Results  Component Value Date   WBC 6.4 09/20/2017   HGB 15.2 09/20/2017   HCT 42.5 09/20/2017   PLT 233.0 09/20/2017   GLUCOSE 128 (H) 09/20/2017   CHOL 195 09/20/2017   TRIG 54.0 09/20/2017   HDL 72.90 09/20/2017   LDLDIRECT 136.5 03/13/2013   LDLCALC 111 (H) 09/20/2017   ALT 20 09/20/2017   AST 21 09/20/2017   NA 141 09/20/2017   K 4.6 09/20/2017   CL 106 09/20/2017   CREATININE 0.94 09/20/2017   BUN 29 (H) 09/20/2017   CO2 28 09/20/2017   TSH 1.07 09/20/2017   PSA 1.79 09/20/2017   INR 1.1 RATIO (H) 07/18/2008   HGBA1C 5.6 09/20/2017    Assessment & Plan:   Warner was seen today  for annual exam and gastroesophageal reflux.  Diagnoses and all orders for this visit:  Chronic viral hepatitis B without delta agent and without coma ([place])- His LFTs are normal and his hep B surface antigen is negative.  Will check an AFP to screen for hepatoma. -     Comprehensive metabolic panel; Future -     CBC with Differential/Platelet; Future -     Hepatitis B surface antigen; Future -     AFP tumor marker; Future  Other abnormal glucose- His A1c is up to 5.6%.  He is mildly prediabetic.  Medical therapy is not indicated. -     Comprehensive metabolic panel; Future -     Hemoglobin A1c; Future  Routine general medical examination at a health care facility- Exam completed, labs reviewed, vaccines reviewed and updated, screening for colon cancer is up-to-date, patient education material was given. -     Lipid panel; Future -     PSA; Future  GERD without esophagitis- Will try omeprazole. -     CBC with Differential/Platelet; Future -     omeprazole (PRILOSEC) 40 MG capsule; Take 1 capsule (40 mg  total) by mouth daily.  Erectile dysfunction due to arterial insufficiency -     Urinalysis, Routine w reflex microscopic; Future -     TSH; Future -     sildenafil (REVATIO) 20 MG tablet; Take 4 tablets (80 mg total) by mouth daily as needed.  Need for shingles vaccine -     Zoster Vaccine Adjuvanted [facility]) injection; Inject 0.5 mLs into the muscle once for 1 dose.   I have discontinued Roselind Messier. Weisgerber's fexofenadine, cefdinir, budesonide-formoterol, and doxycycline. I am also having him start on sildenafil, omeprazole, and Zoster Vaccine Adjuvanted. Additionally, I am having him maintain his albuterol, Fluticasone-Umeclidin-Vilant, and clobetasol ointment.  Meds ordered this encounter  Medications  . sildenafil (REVATIO) 20 MG tablet    Sig: Take 4 tablets (80 mg total) by mouth daily as needed.    Dispense:  60 tablet    Refill:  11  . omeprazole (PRILOSEC) 40 MG capsule     Sig: Take 1 capsule (40 mg total) by mouth daily.    Dispense:  90 capsule    Refill:  1  . Zoster Vaccine Adjuvanted [facility]) injection    Sig: Inject 0.5 mLs into the muscle once for 1 dose.    Dispense:  0.5 mL    Refill:  1     Follow-up: Return in about 3 months (around 12/19/2017).  Scarlette Calico, MD

## 2017-09-20 NOTE — Patient Instructions (Signed)

## 2017-09-21 ENCOUNTER — Encounter: Payer: Self-pay | Admitting: Internal Medicine

## 2017-09-21 LAB — AFP TUMOR MARKER: AFP TUMOR MARKER: 4.2 ng/mL (ref <6.1)

## 2017-09-21 LAB — HEPATITIS B SURFACE ANTIGEN: Hepatitis B Surface Ag: NONREACTIVE

## 2017-09-22 ENCOUNTER — Encounter: Payer: Self-pay | Admitting: Internal Medicine

## 2017-10-06 ENCOUNTER — Telehealth: Payer: Self-pay

## 2017-10-06 NOTE — Telephone Encounter (Signed)
Pt came in today. He is requesting a scan order to be placed to look for heart issues/aneurism (his father passed away from an aneurism).

## 2017-10-08 ENCOUNTER — Other Ambulatory Visit: Payer: Self-pay | Admitting: Internal Medicine

## 2017-10-08 DIAGNOSIS — Z8249 Family history of ischemic heart disease and other diseases of the circulatory system: Secondary | ICD-10-CM | POA: Insufficient documentation

## 2017-10-08 DIAGNOSIS — R1900 Intra-abdominal and pelvic swelling, mass and lump, unspecified site: Secondary | ICD-10-CM

## 2017-10-08 DIAGNOSIS — I720 Aneurysm of carotid artery: Secondary | ICD-10-CM

## 2017-10-08 NOTE — Telephone Encounter (Signed)
Scans ordered

## 2017-10-09 NOTE — Telephone Encounter (Signed)
Pt has been scheduled for his scans.

## 2017-10-16 ENCOUNTER — Telehealth: Payer: Self-pay | Admitting: Internal Medicine

## 2017-10-16 MED ORDER — DOXYCYCLINE HYCLATE 100 MG PO TABS: 100.0000 mg | ORAL_TABLET | Freq: Two times a day (BID) | ORAL | 0 refills | Status: DC

## 2017-10-16 NOTE — Telephone Encounter (Signed)
Called and spoke with Suanne Marker making her aware we were sending an Rx of doxy in for pt and if they needed anything else to call our office.  Rhonda expressed understanding. Rx sent to pharmacy. Nothing further needed at this current time.

## 2017-10-16 NOTE — Telephone Encounter (Signed)
Called and spoke with pt's wife Suanne Marker who stated pt has had sinus pressure and drainage, productive cough with yellow mucus, slight fever ranging between 99.4-100.4. Denies any body aches or chills.  Pt wants to know if anything can be prescribed to help with symptoms.  Dr. Annamaria Boots, please advise on recommendations for pt.  Thanks!   Allergies  Allergen Reactions  . Singulair [Montelukast Sodium] Nausea Only  . Sulfamethoxazole-Trimethoprim Nausea And Vomiting  . Sulfonamide Derivatives Nausea And Vomiting  . Neosporin [Neomycin-Bacitracin Zn-Polymyx] Rash and Other (See Comments)    LOOKS LIKE IT "BURNS" OFF THE TOP LAYER OF SKIN  . Penicillins Rash    Has patient had a PCN reaction causing immediate rash, facial/tongue/throat swelling, SOB or lightheadedness with hypotension: Yes Has patient had a PCN reaction causing severe rash involving mucus membranes or skin necrosis: No Has patient had a PCN reaction that required hospitalization No Has patient had a PCN reaction occurring within the last 10 years: No If all of the above answers are "NO", then may proceed with Cephalosporin use.   Newell Coral [Bacitracin-Polymyxin B] Rash and Other (See Comments)    LOOKS LIKE IT "BURNS" OFF THE TOP LAYER OF SKIN     Current Outpatient Medications:  .  albuterol (PROVENTIL HFA;VENTOLIN HFA) 108 (90 Base) MCG/ACT inhaler, Inhale 2 puffs into the lungs every 6 (six) hours as needed for wheezing or shortness of breath., Disp: 1 Inhaler, Rfl: 6 .  clobetasol ointment (TEMOVATE) 0.05 %, , Disp: , Rfl:  .  Fluticasone-Umeclidin-Vilant (TRELEGY ELLIPTA) 100-62.5-25 MCG/INH AEPB, Inhale 1 puff into the lungs daily., Disp: 1 each, Rfl: 5 .  omeprazole (PRILOSEC) 40 MG capsule, Take 1 capsule (40 mg total) by mouth daily., Disp: 90 capsule, Rfl: 1 .  sildenafil (REVATIO) 20 MG tablet, Take 4 tablets (80 mg total) by mouth daily as needed., Disp: 60 tablet, Rfl: 11

## 2017-10-16 NOTE — Telephone Encounter (Signed)
Offer doxycycline 1090 mg, # 14, 1 twice daily

## 2017-10-19 ENCOUNTER — Ambulatory Visit (INDEPENDENT_AMBULATORY_CARE_PROVIDER_SITE_OTHER)
Admission: RE | Admit: 2017-10-19 | Discharge: 2017-10-19 | Disposition: A | Discharge status: Home / Self Care | Payer: BLUE CROSS/BLUE SHIELD | Source: Ambulatory Visit | Attending: Internal Medicine | Admitting: Internal Medicine

## 2017-10-19 DIAGNOSIS — Z8249 Family history of ischemic heart disease and other diseases of the circulatory system: Secondary | ICD-10-CM

## 2017-10-19 DIAGNOSIS — I720 Aneurysm of carotid artery: Secondary | ICD-10-CM | POA: Diagnosis not present

## 2017-10-19 DIAGNOSIS — R1900 Intra-abdominal and pelvic swelling, mass and lump, unspecified site: Secondary | ICD-10-CM

## 2017-10-19 MED ORDER — IOPAMIDOL (ISOVUE-370) INJECTION 76%: 100.0000 mL | Freq: Once | INTRAVENOUS | Status: DC | PRN

## 2017-10-20 ENCOUNTER — Telehealth: Payer: Self-pay | Admitting: *Deleted

## 2017-10-20 NOTE — Telephone Encounter (Signed)
Pt has been notified w/MD response. appt made for f/u 3/*19/19.Marland KitchenJohny Richardson

## 2017-10-20 NOTE — Telephone Encounter (Signed)
-----   Message from Janith Lima, MD sent at 10/20/2017 11:40 AM EST ----- Please let him know that his scans did not show any aneurysms in his chest or abdomen. It did show a mild cholesterol lining his aorta. We can talk about risk factor modifications in the future.

## 2017-10-25 ENCOUNTER — Ambulatory Visit: Payer: BLUE CROSS/BLUE SHIELD | Admitting: Internal Medicine

## 2017-11-07 ENCOUNTER — Encounter: Payer: Self-pay | Admitting: Internal Medicine

## 2017-11-07 ENCOUNTER — Ambulatory Visit (INDEPENDENT_AMBULATORY_CARE_PROVIDER_SITE_OTHER): Payer: BLUE CROSS/BLUE SHIELD | Admitting: Internal Medicine

## 2017-11-07 VITALS — BP 146/96 | HR 70 | Temp 97.8°F | Resp 16 | Ht 71.0 in | Wt 205.0 lb

## 2017-11-07 DIAGNOSIS — I10 Essential (primary) hypertension: Secondary | ICD-10-CM

## 2017-11-07 DIAGNOSIS — I700 Atherosclerosis of aorta: Secondary | ICD-10-CM | POA: Diagnosis not present

## 2017-11-07 NOTE — Progress Notes (Signed)
Subjective:  Patient ID: Matthew Richardson, male    DOB: Feb 15, 1961  Age: 57 y.o. MRN: [id number]  CC: Hypertension   HPI AZIAH BROSTROM presents for f/up - He has a family history of aneurysm so he recently underwent a CT scan of the chest and abdomen which fortunately did not reveal any aortic aneurysms but it did show mild atherosclerosis.  He is concerned about this but.  He feels well and offers no complaints.  He is very active with his cardiovascular workouts and denies any recent episodes of CP, DOE, palpitations, edema, or fatigue.  He admits that he drinks a lot of caffeine and energy drinks.  Outpatient Medications Prior to Visit  Medication Sig Dispense Refill  . albuterol (PROVENTIL HFA;VENTOLIN HFA) 108 (90 Base) MCG/ACT inhaler Inhale 2 puffs into the lungs every 6 (six) hours as needed for wheezing or shortness of breath. 1 Inhaler 6  . clobetasol ointment (TEMOVATE) 0.05 %     . Fluticasone-Umeclidin-Vilant (TRELEGY ELLIPTA) 100-62.5-25 MCG/INH AEPB Inhale 1 puff into the lungs daily. 1 each 5  . omeprazole (PRILOSEC) 40 MG capsule Take 1 capsule (40 mg total) by mouth daily. 90 capsule 1  . sildenafil (REVATIO) 20 MG tablet Take 4 tablets (80 mg total) by mouth daily as needed. 60 tablet 11  . doxycycline (VIBRA-TABS) 100 MG tablet Take 1 tablet (100 mg total) by mouth 2 (two) times daily. 14 tablet 0   No facility-administered medications prior to visit.     ROS Review of Systems  Constitutional: Negative.  Negative for diaphoresis, fatigue and unexpected weight change.  HENT: Negative.   Eyes: Negative for visual disturbance.  Respiratory: Negative for cough, chest tightness, shortness of breath and wheezing.   Cardiovascular: Negative for chest pain, palpitations and leg swelling.  Gastrointestinal: Negative for abdominal pain, constipation, diarrhea, nausea and vomiting.  Endocrine: Negative.   Genitourinary: Negative.  Negative for difficulty urinating.    Musculoskeletal: Negative.  Negative for arthralgias and myalgias.  Skin: Negative.  Negative for color change, pallor and rash.  Neurological: Negative.  Negative for dizziness, weakness and light-headedness.  Hematological: Negative.   Psychiatric/Behavioral: Negative.     Objective:  BP (!) 146/96 (BP Location: Left Arm, Patient Position: Sitting, Cuff Size: Normal)   Pulse 70   Temp 97.8 F (36.6 C) (Oral)   Resp 16   Ht 5\' 11"  (1.803 m)   Wt 205 lb 0.6 oz (93 kg)   SpO2 97%   BMI 28.60 kg/m   BP Readings from Last 3 Encounters:  11/07/17 (!) 146/96  09/20/17 130/70  10/19/16 122/70    Wt Readings from Last 3 Encounters:  11/07/17 205 lb 0.6 oz (93 kg)  09/20/17 212 lb 1.3 oz (96.2 kg)  10/19/16 204 lb 12.8 oz (92.9 kg)    Physical Exam  Constitutional: He is oriented to person, place, and time. No distress.  HENT:  Mouth/Throat: Oropharynx is clear and moist. No oropharyngeal exudate.  Eyes: Conjunctivae are normal. Left eye exhibits no discharge. No scleral icterus.  Neck: Normal range of motion. Neck supple. No JVD present. No thyromegaly present.  Cardiovascular: Normal rate, regular rhythm and normal heart sounds. Exam reveals no gallop.  No murmur heard. Pulmonary/Chest: Effort normal and breath sounds normal. No respiratory distress. He has no wheezes. He has no rales.  Abdominal: Soft. Bowel sounds are normal. He exhibits no distension and no mass. There is no guarding.  Musculoskeletal: Normal range of motion. He exhibits  no edema, tenderness or deformity.  Lymphadenopathy:    He has no cervical adenopathy.  Neurological: He is alert and oriented to person, place, and time.  Skin: Skin is warm and dry. No rash noted. He is not diaphoretic. No erythema. No pallor.  Vitals reviewed.   Lab Results  Component Value Date   WBC 6.4 09/20/2017   HGB 15.2 09/20/2017   HCT 42.5 09/20/2017   PLT 233.0 09/20/2017   GLUCOSE 128 (H) 09/20/2017   CHOL 195  09/20/2017   TRIG 54.0 09/20/2017   HDL 72.90 09/20/2017   LDLDIRECT 136.5 03/13/2013   LDLCALC 111 (H) 09/20/2017   ALT 20 09/20/2017   AST 21 09/20/2017   NA 141 09/20/2017   K 4.6 09/20/2017   CL 106 09/20/2017   CREATININE 0.94 09/20/2017   BUN 29 (H) 09/20/2017   CO2 28 09/20/2017   TSH 1.07 09/20/2017   PSA 1.79 09/20/2017   INR 1.1 RATIO (H) 07/18/2008   HGBA1C 5.6 09/20/2017    Ct Angio Chest Aorta W/cm &/or Wo/cm  Result Date: 10/19/2017 CLINICAL DATA:  Family history of aneurysm. Abdominal mass, non pulsatile, palpable. History of hernia. EXAM: CT ANGIOGRAPHY CHEST, ABDOMEN AND PELVIS TECHNIQUE: Multidetector CT imaging through the chest, abdomen and pelvis was performed using the standard protocol during bolus administration of intravenous contrast. Multiplanar reconstructed images and MIPs were obtained and reviewed to evaluate the vascular anatomy. CONTRAST:  100 cc Isovue 370 intravenous COMPARISON:  Abdominal CT 07/12/2013 FINDINGS: CTA CHEST FINDINGS Cardiovascular: Normal heart size. No pericardial effusion. No atheromatous changes seen along the aorta. Negative for dissection. There is no aortic aneurysm. Aortic diameters: Valve annulus 22 mm Sinuses of Valsalva 39 mm Sino-tubular junction 31 mm Ascending segment 33 mm Arch 26 mm Descending segment 28 mm Mediastinum/Nodes: Negative for adenopathy. Lungs/Pleura: There is no edema, consolidation, effusion, or pneumothorax. Musculoskeletal: Spondylosis. Glenohumeral osteoarthritis with subchondral cysts. Review of the MIP images confirms the above findings. CTA ABDOMEN AND PELVIS FINDINGS VASCULAR Aorta: Normal diameter. Atheromatous wall thickening and calcification of the infrarenal aorta. No dissection or inflammatory changes. Celiac: Patent without evidence of aneurysm, dissection, vasculitis or significant stenosis. SMA: Patent without evidence of aneurysm, dissection, vasculitis or significant stenosis. Renals: Duplicated  left renal arteries. Vessels are smooth and diffusely patent. IMA: Patent Inflow: Patent without evidence of aneurysm, dissection, vasculitis or significant stenosis. Veins: No obvious venous abnormality within the limitations of this arterial phase study. Review of the MIP images confirms the above findings. NON-VASCULAR Hepatobiliary: Cyst in the left liver. Negative gallbladder and biliary tree. Pancreas: Negative Spleen: Heterogeneous density attributed to contrast timing. Adrenals/Urinary Tract: Symmetric normal renal enhancement. Negative adrenal glands. No hydronephrosis or urolithiasis. Urinary bladder is unremarkable. Stomach/Bowel: Negative. Lymphatic: Negative for adenopathy. Reproductive: Nonspecific prostate calcifications. Other: Fatty umbilical hernia that is small and nonprogressive when compared to prior. Fatty enlargement of the left inguinal canal. Musculoskeletal: No acute or aggressive finding. Review of the MIP images confirms the above findings. IMPRESSION: 1. Negative for aortic aneurysm. 2. Overall mild aortic atherosclerosis seen distally. No visceral stenosis. 3. Fatty umbilical hernia without progression from 2014. Electronically Signed   By: Monte Fantasia M.D.   On: 10/19/2017 16:28   Ct Angio Abd/pel W/ And/or W/o  Result Date: 10/19/2017 CLINICAL DATA:  Family history of aneurysm. Abdominal mass, non pulsatile, palpable. History of hernia. EXAM: CT ANGIOGRAPHY CHEST, ABDOMEN AND PELVIS TECHNIQUE: Multidetector CT imaging through the chest, abdomen and pelvis was performed using the standard protocol  during bolus administration of intravenous contrast. Multiplanar reconstructed images and MIPs were obtained and reviewed to evaluate the vascular anatomy. CONTRAST:  100 cc Isovue 370 intravenous COMPARISON:  Abdominal CT 07/12/2013 FINDINGS: CTA CHEST FINDINGS Cardiovascular: Normal heart size. No pericardial effusion. No atheromatous changes seen along the aorta. Negative for  dissection. There is no aortic aneurysm. Aortic diameters: Valve annulus 22 mm Sinuses of Valsalva 39 mm Sino-tubular junction 31 mm Ascending segment 33 mm Arch 26 mm Descending segment 28 mm Mediastinum/Nodes: Negative for adenopathy. Lungs/Pleura: There is no edema, consolidation, effusion, or pneumothorax. Musculoskeletal: Spondylosis. Glenohumeral osteoarthritis with subchondral cysts. Review of the MIP images confirms the above findings. CTA ABDOMEN AND PELVIS FINDINGS VASCULAR Aorta: Normal diameter. Atheromatous wall thickening and calcification of the infrarenal aorta. No dissection or inflammatory changes. Celiac: Patent without evidence of aneurysm, dissection, vasculitis or significant stenosis. SMA: Patent without evidence of aneurysm, dissection, vasculitis or significant stenosis. Renals: Duplicated left renal arteries. Vessels are smooth and diffusely patent. IMA: Patent Inflow: Patent without evidence of aneurysm, dissection, vasculitis or significant stenosis. Veins: No obvious venous abnormality within the limitations of this arterial phase study. Review of the MIP images confirms the above findings. NON-VASCULAR Hepatobiliary: Cyst in the left liver. Negative gallbladder and biliary tree. Pancreas: Negative Spleen: Heterogeneous density attributed to contrast timing. Adrenals/Urinary Tract: Symmetric normal renal enhancement. Negative adrenal glands. No hydronephrosis or urolithiasis. Urinary bladder is unremarkable. Stomach/Bowel: Negative. Lymphatic: Negative for adenopathy. Reproductive: Nonspecific prostate calcifications. Other: Fatty umbilical hernia that is small and nonprogressive when compared to prior. Fatty enlargement of the left inguinal canal. Musculoskeletal: No acute or aggressive finding. Review of the MIP images confirms the above findings. IMPRESSION: 1. Negative for aortic aneurysm. 2. Overall mild aortic atherosclerosis seen distally. No visceral stenosis. 3. Fatty umbilical  hernia without progression from 2014. Electronically Signed   By: Monte Fantasia M.D.   On: 10/19/2017 16:28    Assessment & Plan:   Treylan was seen today for hypertension.  Diagnoses and all orders for this visit:  Essential hypertension- He has new onset hypertension.  He is not willing to start an antihypertensive agent.  He will continue to work on his lifestyle modification and decrease his intake of caffeine.   He agrees to return in about 3 months for me to recheck his blood pressure.  Atherosclerosis of aorta ([place])- This was mild on the recent CT scan.  His ASCVD risk score is only 5.1%.  With this in mind we both decided that he would not start taking an aspirin or statin for CV risk reduction.   I have discontinued Roselind Messier. Arnone's doxycycline. I am also having him maintain his albuterol, Fluticasone-Umeclidin-Vilant, clobetasol ointment, sildenafil, and omeprazole.  No orders of the defined types were placed in this encounter.    Follow-up: Return in about 3 months (around 02/07/2018).  Scarlette Calico, MD

## 2017-11-07 NOTE — Patient Instructions (Signed)

## 2017-11-29 ENCOUNTER — Other Ambulatory Visit: Payer: Self-pay | Admitting: Internal Medicine

## 2018-01-26 ENCOUNTER — Ambulatory Visit: Payer: BLUE CROSS/BLUE SHIELD | Admitting: Internal Medicine

## 2018-02-22 ENCOUNTER — Other Ambulatory Visit: Payer: Self-pay | Admitting: Internal Medicine

## 2018-02-22 DIAGNOSIS — K219 Gastro-esophageal reflux disease without esophagitis: Secondary | ICD-10-CM

## 2018-04-16 ENCOUNTER — Other Ambulatory Visit: Payer: Self-pay | Admitting: Internal Medicine

## 2018-04-19 ENCOUNTER — Ambulatory Visit: Payer: BLUE CROSS/BLUE SHIELD | Admitting: Internal Medicine

## 2018-05-24 ENCOUNTER — Encounter: Payer: Self-pay | Admitting: Internal Medicine

## 2018-05-24 ENCOUNTER — Ambulatory Visit (INDEPENDENT_AMBULATORY_CARE_PROVIDER_SITE_OTHER): Payer: BLUE CROSS/BLUE SHIELD | Admitting: Internal Medicine

## 2018-05-24 ENCOUNTER — Telehealth: Payer: Self-pay | Admitting: Internal Medicine

## 2018-05-24 DIAGNOSIS — Z23 Encounter for immunization: Secondary | ICD-10-CM

## 2018-05-24 DIAGNOSIS — J4520 Mild intermittent asthma, uncomplicated: Secondary | ICD-10-CM

## 2018-05-24 DIAGNOSIS — G4733 Obstructive sleep apnea (adult) (pediatric): Secondary | ICD-10-CM

## 2018-05-24 MED ORDER — FLUTICASONE-UMECLIDIN-VILANT 100-62.5-25 MCG/INH IN AEPB: 1.0000 | INHALATION_SPRAY | RESPIRATORY_TRACT | 5 refills | Status: AC

## 2018-05-24 MED ORDER — FLUTICASONE-UMECLIDIN-VILANT 100-62.5-25 MCG/INH IN AEPB: 1.0000 | INHALATION_SPRAY | RESPIRATORY_TRACT | 5 refills | Status: DC

## 2018-05-24 NOTE — Telephone Encounter (Signed)
Patient is requesting refill on sildenafil to be sent to [facility] in [place].

## 2018-05-24 NOTE — Progress Notes (Signed)
Patient ID: Matthew Richardson, male    DOB: [birth year], 57 y.o.   MRN: [id number]  HPI Male former smoker followed for allergic rhinitis, EIA, OSA/quit CPAP, complicated by kidney stone, GERD, hepatitis B, HBP NPSG 06/14/12- AHI 9.5 per hour, moderately loud snoring. Weight 190 pounds Office spirometry 10/19/16- WNL- FVC 4.46/ 87%, FEV1 3.29/ 84%, ratio 0.74, 25-75% 2.91/ 86% FENO 10/19/16- 15 (WNL)  ---------------------------------------------------------------------  10/19/2016- 53 yoM fomer smoker followed for allergic rhinitis, EIAsthma, OSA/quit CPAP, complicated by kidney stone, Hep B FOLLOWS FOR: Pt states he needs something to help with SOB and wheezing-worse with activity. Known allergy to mold and ragweed. More persistent chest tightness with slight wheeze in the last 2 months. Did some painting without a mask. Still runs regularly but more shortness of breath and chest tightness running in really cold air. Has used wife's inhaler with definite help. Office spirometry 10/19/16- WNL- FVC 4.46/ 87%, FEV1 3.29/ 84%, ratio 0.74, 25-75% 2.91/ 86% FENO 10/19/16- 15 (WNL)  05/24/2018- 6 yoM fomer smoker followed for allergic rhinitis, EIAsthma, OSA/quit CPAP, complicated by kidney stone, Hep B, GERD, HBP -----Asthma: Yearly follow up-continues inhalers Trelegy, albuterol HFA Denies wheezing and has rarely felt need for an inhaler. No allergic nose symptoms of consequence this fall so far.  Review of Systems-see HPI sign = positive Constitutional:   No-   weight loss, night sweats, fevers, chills,+ fatigue, lassitude. HEENT:   No-  headaches, difficulty swallowing, tooth/dental problems, sore throat,       No-  sneezing, itching, ear ache, +nasal congestion, post nasal drip,  CV:  No-   chest pain, orthopnea, PND, swelling in lower extremities, anasarca, dizziness, palpitations Resp: + shortness of breath with exertion or at rest.              No-   productive cough,  No non-productive cough,   No-  coughing up of blood.              No-   change in color of mucus.   wheezing.   Skin: No-   rash or lesions. GI:  No-   heartburn, indigestion, abdominal pain, nausea, vomiting,  GU:  MS:  No-   joint pain or swelling.   Neuro- nothing unusual Psych:  No- change in mood or affect. No depression or anxiety.  No memory loss.     Objective:   Physical Exam General- Alert, Oriented, Affect-appropriate, Distress- none acute; very fit appearing-note sinus bradycardia. Trim Skin- rash-none, lesions- none, excoriation- none.  Lymphadenopathy- none Head- atraumatic            Eyes- Gross vision intact, PERRLA, conjunctivae clear secretions            Ears- Hearing, canals normal            Nose- Clear, no-Septal dev, mucus, polyps, erosion, perforation             Throat- Mallampati III-IV , mucosa clear , drainage- none, tonsils- atrophic Neck- flexible , trachea midline, no stridor , thyroid nl, carotid no bruit Chest - symmetrical excursion , unlabored           Heart/CV- RRR , no murmur , no gallop  , no rub, nl s1 s2                           - JVD- none , edema- none, stasis changes- none, varices- none  Lung- clear, wheeze- none, cough- none , dullness-none, rub- none           Chest wall-  Abd-  Br/ Gen/ Rectal- Not done, not indicated Extrem- cyanosis- none, clubbing, none, atrophy- none, strength- nl Neuro- grossly intact to observation

## 2018-05-24 NOTE — Patient Instructions (Signed)
Ok to try off Trelegy to see how you do. Let us know if you begin having more breathing trouble.  Order- Flu vax standard  Please call if we can help

## 2018-05-27 NOTE — Assessment & Plan Note (Signed)
He has kept his weight down and wife has not told him of significant observed apneas or snoring.  No treatment otherwise.

## 2018-05-27 NOTE — Assessment & Plan Note (Signed)
He has felt well with no exacerbations.  Trigger in the past did seem to be exercise but he is not noticing that now.  [place]. Plan-we will leave Trelegy on his medication list but he is going to see how he does without it.  Understands use of rescue inhaler if needed.

## 2018-05-28 NOTE — Telephone Encounter (Signed)
Pt was to follow up on htn in June. Can you have pt schedule follow up ?

## 2018-05-29 ENCOUNTER — Other Ambulatory Visit: Payer: Self-pay | Admitting: Internal Medicine

## 2018-05-29 DIAGNOSIS — N5201 Erectile dysfunction due to arterial insufficiency: Secondary | ICD-10-CM

## 2018-05-29 MED ORDER — SILDENAFIL CITRATE 20 MG PO TABS: 80.0000 mg | ORAL_TABLET | Freq: Every day | ORAL | 1 refills | Status: DC | PRN

## 2018-05-29 NOTE — Telephone Encounter (Signed)
Pt has scheduled an appt on 06/19/2018. Are you okay with rx'ing sildenafil.

## 2018-06-19 ENCOUNTER — Encounter: Payer: Self-pay | Admitting: Internal Medicine

## 2018-06-19 ENCOUNTER — Ambulatory Visit (INDEPENDENT_AMBULATORY_CARE_PROVIDER_SITE_OTHER): Payer: BLUE CROSS/BLUE SHIELD | Admitting: Internal Medicine

## 2018-06-19 VITALS — BP 138/88 | HR 57 | Temp 98.3°F | Resp 16 | Ht 71.0 in | Wt 202.8 lb

## 2018-06-19 DIAGNOSIS — I10 Essential (primary) hypertension: Secondary | ICD-10-CM

## 2018-06-19 DIAGNOSIS — N5201 Erectile dysfunction due to arterial insufficiency: Secondary | ICD-10-CM

## 2018-06-19 MED ORDER — SILDENAFIL CITRATE 20 MG PO TABS: 80.0000 mg | ORAL_TABLET | Freq: Every day | ORAL | 5 refills | Status: DC | PRN

## 2018-06-19 NOTE — Patient Instructions (Signed)

## 2018-06-19 NOTE — Progress Notes (Signed)
Subjective:  Patient ID: Matthew Richardson, male    DOB: 07-01-1961  Age: 57 y.o. MRN: [id number]  CC: Hypertension   HPI Matthew Richardson presents for f/up - He tells me his blood pressure has been well controlled.  He is not taking a medication but is working on his lifestyle modifications.  He is very active and denies any recent episodes of CP, DOE, palpitations, edema, or fatigue.  Outpatient Medications Prior to Visit  Medication Sig Dispense Refill  . albuterol (PROVENTIL HFA;VENTOLIN HFA) 108 (90 Base) MCG/ACT inhaler INHALE 2 PUFFS BY MOUTH EVERY 6 HOURS AS NEEDED FOR WHEEZING OR SHORTNESS OF BREATH 18 each 6  . omeprazole (PRILOSEC) 40 MG capsule TAKE 1 CAPSULE BY MOUTH DAILY 90 capsule 1  . sildenafil (REVATIO) 20 MG tablet Take 4 tablets (80 mg total) by mouth daily as needed. 60 tablet 1  . clobetasol ointment (TEMOVATE) 0.05 %      No facility-administered medications prior to visit.     ROS Review of Systems  Constitutional: Negative for diaphoresis and fatigue.  HENT: Negative.   Eyes: Negative for visual disturbance.  Respiratory: Negative.  Negative for chest tightness and shortness of breath.   Cardiovascular: Negative for chest pain, palpitations and leg swelling.  Gastrointestinal: Negative for abdominal pain, constipation, diarrhea, nausea and vomiting.  Endocrine: Negative.   Genitourinary: Negative.  Negative for difficulty urinating.  Musculoskeletal: Negative.  Negative for arthralgias and myalgias.  Skin: Negative.  Negative for color change and rash.  Neurological: Negative.  Negative for dizziness, weakness, light-headedness and headaches.  Hematological: Negative for adenopathy. Does not bruise/bleed easily.  Psychiatric/Behavioral: Negative.     Objective:  BP 138/88 (BP Location: Left Arm, Patient Position: Sitting, Cuff Size: Normal)   Pulse (!) 57   Temp 98.3 F (36.8 C) (Oral)   Resp 16   Ht 5\' 11"  (1.803 m)   Wt 202 lb 12 oz (92 kg)   SpO2  97%   BMI 28.28 kg/m   BP Readings from Last 3 Encounters:  06/19/18 138/88  05/24/18 120/76  11/07/17 (!) 146/96    Wt Readings from Last 3 Encounters:  06/19/18 202 lb 12 oz (92 kg)  05/24/18 197 lb 6.4 oz (89.5 kg)  11/07/17 205 lb 0.6 oz (93 kg)    Physical Exam  Constitutional: He is oriented to person, place, and time. No distress.  HENT:  Mouth/Throat: Oropharynx is clear and moist. No oropharyngeal exudate.  Eyes: Conjunctivae are normal. No scleral icterus.  Neck: Normal range of motion. Neck supple. No JVD present. No thyromegaly present.  Cardiovascular: Normal rate, regular rhythm and normal heart sounds. Exam reveals no gallop.  No murmur heard. Pulmonary/Chest: Effort normal and breath sounds normal. No respiratory distress. He has no wheezes. He has no rales.  Abdominal: Soft. Bowel sounds are normal. He exhibits no mass. There is no hepatosplenomegaly. There is no tenderness.  Musculoskeletal: Normal range of motion. He exhibits no edema, tenderness or deformity.  Lymphadenopathy:    He has no cervical adenopathy.  Neurological: He is alert and oriented to person, place, and time.  Skin: Skin is warm and dry. No rash noted. He is not diaphoretic.  Vitals reviewed.   Lab Results  Component Value Date   WBC 6.4 09/20/2017   HGB 15.2 09/20/2017   HCT 42.5 09/20/2017   PLT 233.0 09/20/2017   GLUCOSE 128 (H) 09/20/2017   CHOL 195 09/20/2017   TRIG 54.0 09/20/2017   HDL  72.90 09/20/2017   LDLDIRECT 136.5 03/13/2013   LDLCALC 111 (H) 09/20/2017   ALT 20 09/20/2017   AST 21 09/20/2017   NA 141 09/20/2017   K 4.6 09/20/2017   CL 106 09/20/2017   CREATININE 0.94 09/20/2017   BUN 29 (H) 09/20/2017   CO2 28 09/20/2017   TSH 1.07 09/20/2017   PSA 1.79 09/20/2017   INR 1.1 RATIO (H) 07/18/2008   HGBA1C 5.6 09/20/2017    Ct Angio Chest Aorta W/cm &/or Wo/cm  Result Date: 10/19/2017 CLINICAL DATA:  Family history of aneurysm. Abdominal mass, non  pulsatile, palpable. History of hernia. EXAM: CT ANGIOGRAPHY CHEST, ABDOMEN AND PELVIS TECHNIQUE: Multidetector CT imaging through the chest, abdomen and pelvis was performed using the standard protocol during bolus administration of intravenous contrast. Multiplanar reconstructed images and MIPs were obtained and reviewed to evaluate the vascular anatomy. CONTRAST:  100 cc Isovue 370 intravenous COMPARISON:  Abdominal CT 07/12/2013 FINDINGS: CTA CHEST FINDINGS Cardiovascular: Normal heart size. No pericardial effusion. No atheromatous changes seen along the aorta. Negative for dissection. There is no aortic aneurysm. Aortic diameters: Valve annulus 22 mm Sinuses of Valsalva 39 mm Sino-tubular junction 31 mm Ascending segment 33 mm Arch 26 mm Descending segment 28 mm Mediastinum/Nodes: Negative for adenopathy. Lungs/Pleura: There is no edema, consolidation, effusion, or pneumothorax. Musculoskeletal: Spondylosis. Glenohumeral osteoarthritis with subchondral cysts. Review of the MIP images confirms the above findings. CTA ABDOMEN AND PELVIS FINDINGS VASCULAR Aorta: Normal diameter. Atheromatous wall thickening and calcification of the infrarenal aorta. No dissection or inflammatory changes. Celiac: Patent without evidence of aneurysm, dissection, vasculitis or significant stenosis. SMA: Patent without evidence of aneurysm, dissection, vasculitis or significant stenosis. Renals: Duplicated left renal arteries. Vessels are smooth and diffusely patent. IMA: Patent Inflow: Patent without evidence of aneurysm, dissection, vasculitis or significant stenosis. Veins: No obvious venous abnormality within the limitations of this arterial phase study. Review of the MIP images confirms the above findings. NON-VASCULAR Hepatobiliary: Cyst in the left liver. Negative gallbladder and biliary tree. Pancreas: Negative Spleen: Heterogeneous density attributed to contrast timing. Adrenals/Urinary Tract: Symmetric normal renal  enhancement. Negative adrenal glands. No hydronephrosis or urolithiasis. Urinary bladder is unremarkable. Stomach/Bowel: Negative. Lymphatic: Negative for adenopathy. Reproductive: Nonspecific prostate calcifications. Other: Fatty umbilical hernia that is small and nonprogressive when compared to prior. Fatty enlargement of the left inguinal canal. Musculoskeletal: No acute or aggressive finding. Review of the MIP images confirms the above findings. IMPRESSION: 1. Negative for aortic aneurysm. 2. Overall mild aortic atherosclerosis seen distally. No visceral stenosis. 3. Fatty umbilical hernia without progression from 2014. Electronically Signed   By: Monte Fantasia M.D.   On: 10/19/2017 16:28   Ct Angio Abd/pel W/ And/or W/o  Result Date: 10/19/2017 CLINICAL DATA:  Family history of aneurysm. Abdominal mass, non pulsatile, palpable. History of hernia. EXAM: CT ANGIOGRAPHY CHEST, ABDOMEN AND PELVIS TECHNIQUE: Multidetector CT imaging through the chest, abdomen and pelvis was performed using the standard protocol during bolus administration of intravenous contrast. Multiplanar reconstructed images and MIPs were obtained and reviewed to evaluate the vascular anatomy. CONTRAST:  100 cc Isovue 370 intravenous COMPARISON:  Abdominal CT 07/12/2013 FINDINGS: CTA CHEST FINDINGS Cardiovascular: Normal heart size. No pericardial effusion. No atheromatous changes seen along the aorta. Negative for dissection. There is no aortic aneurysm. Aortic diameters: Valve annulus 22 mm Sinuses of Valsalva 39 mm Sino-tubular junction 31 mm Ascending segment 33 mm Arch 26 mm Descending segment 28 mm Mediastinum/Nodes: Negative for adenopathy. Lungs/Pleura: There is no edema, consolidation, effusion,  or pneumothorax. Musculoskeletal: Spondylosis. Glenohumeral osteoarthritis with subchondral cysts. Review of the MIP images confirms the above findings. CTA ABDOMEN AND PELVIS FINDINGS VASCULAR Aorta: Normal diameter. Atheromatous wall  thickening and calcification of the infrarenal aorta. No dissection or inflammatory changes. Celiac: Patent without evidence of aneurysm, dissection, vasculitis or significant stenosis. SMA: Patent without evidence of aneurysm, dissection, vasculitis or significant stenosis. Renals: Duplicated left renal arteries. Vessels are smooth and diffusely patent. IMA: Patent Inflow: Patent without evidence of aneurysm, dissection, vasculitis or significant stenosis. Veins: No obvious venous abnormality within the limitations of this arterial phase study. Review of the MIP images confirms the above findings. NON-VASCULAR Hepatobiliary: Cyst in the left liver. Negative gallbladder and biliary tree. Pancreas: Negative Spleen: Heterogeneous density attributed to contrast timing. Adrenals/Urinary Tract: Symmetric normal renal enhancement. Negative adrenal glands. No hydronephrosis or urolithiasis. Urinary bladder is unremarkable. Stomach/Bowel: Negative. Lymphatic: Negative for adenopathy. Reproductive: Nonspecific prostate calcifications. Other: Fatty umbilical hernia that is small and nonprogressive when compared to prior. Fatty enlargement of the left inguinal canal. Musculoskeletal: No acute or aggressive finding. Review of the MIP images confirms the above findings. IMPRESSION: 1. Negative for aortic aneurysm. 2. Overall mild aortic atherosclerosis seen distally. No visceral stenosis. 3. Fatty umbilical hernia without progression from 2014. Electronically Signed   By: Monte Fantasia M.D.   On: 10/19/2017 16:28    Assessment & Plan:   Trashaun was seen today for hypertension.  Diagnoses and all orders for this visit:  Essential hypertension- His blood pressure is well controlled.  Medical therapy is not indicated.  Erectile dysfunction due to arterial insufficiency -     sildenafil (REVATIO) 20 MG tablet; Take 4 tablets (80 mg total) by mouth daily as needed.   I have discontinued Roselind Messier. Tsukamoto's clobetasol  ointment. I am also having him maintain his omeprazole, albuterol, and sildenafil.  Meds ordered this encounter  Medications  . sildenafil (REVATIO) 20 MG tablet    Sig: Take 4 tablets (80 mg total) by mouth daily as needed.    Dispense:  60 tablet    Refill:  5     Follow-up: No follow-ups on file.  Scarlette Calico, MD

## 2018-07-03 ENCOUNTER — Other Ambulatory Visit: Payer: Self-pay | Admitting: Internal Medicine

## 2018-07-03 DIAGNOSIS — K219 Gastro-esophageal reflux disease without esophagitis: Secondary | ICD-10-CM

## 2018-11-05 ENCOUNTER — Other Ambulatory Visit: Payer: Self-pay | Admitting: Internal Medicine

## 2018-11-05 DIAGNOSIS — K219 Gastro-esophageal reflux disease without esophagitis: Secondary | ICD-10-CM

## 2018-12-11 ENCOUNTER — Other Ambulatory Visit: Payer: Self-pay | Admitting: Internal Medicine

## 2018-12-11 ENCOUNTER — Telehealth: Payer: Self-pay | Admitting: Internal Medicine

## 2018-12-11 DIAGNOSIS — J4520 Mild intermittent asthma, uncomplicated: Secondary | ICD-10-CM

## 2018-12-11 MED ORDER — FLUTICASONE-UMECLIDIN-VILANT 100-62.5-25 MCG/INH IN AEPB
1.0000 | INHALATION_SPRAY | Freq: Every day | RESPIRATORY_TRACT | 4 refills | Status: AC
Start: 2018-12-11 — End: 2019-03-11

## 2018-12-11 NOTE — Telephone Encounter (Signed)
   Pt was not able to stay off Trelegy but 2 weeks. He is requesting refill of Trelegy.        Return in about 1 year (around 05/25/2019).  Ok to try off Trelegy to see how you do. Let us know if you begin having more breathing trouble.  Order- Flu vax standard  Please call if we can help

## 2018-12-11 NOTE — Telephone Encounter (Signed)
Called that patient back to advise Dr. Annamaria Boots approved prescription to be sent to pharmacy for Trelegy and directions for usage. Patient voiced understanding.  Patient confirmed to send 90 day supply to [facility] on [address] in [place].  Nothing further needed at this time.

## 2018-12-11 NOTE — Telephone Encounter (Signed)
Ok to refill Trelegy # 60, inhale 1 puff then rinse mouth, once daily, refill x 12 months Ok to do 90 day if he prefers

## 2018-12-26 ENCOUNTER — Other Ambulatory Visit: Payer: Self-pay | Admitting: Internal Medicine

## 2018-12-26 DIAGNOSIS — K219 Gastro-esophageal reflux disease without esophagitis: Secondary | ICD-10-CM

## 2019-03-28 ENCOUNTER — Telehealth: Payer: Self-pay | Admitting: Internal Medicine

## 2019-03-28 NOTE — Telephone Encounter (Signed)
Medication Refill - Medication: sildenafil (REVATIO) 20 MG tablet   Has the patient contacted their pharmacy? Yes.   (Agent: If no, request that the patient contact the pharmacy for the refill.) (Agent: If yes, when and what did the pharmacy advise?)  Preferred Pharmacy (with phone number or street name):  [facility], [place] [phone] (Phone) [phone] (Fax)     Agent: Please be advised that RX refills may take up to 3 business days. We ask that you follow-up with your pharmacy.

## 2019-04-01 NOTE — Telephone Encounter (Signed)
He is due for an OV, PCX, labs, etc

## 2019-04-01 NOTE — Telephone Encounter (Signed)
LVM to call back to set up a OV.

## 2019-04-01 NOTE — Telephone Encounter (Signed)
Denied refill.Forwarding msg to PCP pls advise on msg below.../lmb msg to schedule OV per MD request../lmb

## 2019-05-21 ENCOUNTER — Other Ambulatory Visit: Payer: Self-pay

## 2019-05-21 ENCOUNTER — Encounter: Payer: Self-pay | Admitting: Adult Health

## 2019-05-21 ENCOUNTER — Ambulatory Visit (INDEPENDENT_AMBULATORY_CARE_PROVIDER_SITE_OTHER): Payer: BC Managed Care – PPO | Admitting: Adult Health

## 2019-05-21 DIAGNOSIS — J069 Acute upper respiratory infection, unspecified: Secondary | ICD-10-CM | POA: Diagnosis not present

## 2019-05-21 DIAGNOSIS — Z20822 Contact with and (suspected) exposure to covid-19: Secondary | ICD-10-CM

## 2019-05-21 MED ORDER — DOXYCYCLINE HYCLATE 100 MG PO TABS: 100.0000 mg | ORAL_TABLET | Freq: Two times a day (BID) | ORAL | 0 refills | Status: DC

## 2019-05-21 NOTE — Addendum Note (Signed)
Addended by: Parke Poisson E on: 05/21/2019 09:53 AM   Modules accepted: Orders

## 2019-05-21 NOTE — Progress Notes (Signed)
Virtual Visit via Telephone Note  I connected with Matthew Richardson on 05/21/19 at  9:00 AM EDT by telephone and verified that I am speaking with the correct person using two identifiers.  Location: Patient: Home  Provider: Office    I discussed the limitations, risks, security and privacy concerns of performing an evaluation and management service by telephone and the availability of in person appointments. I also discussed with the patient that there may be a patient responsible charge related to this service. The patient expressed understanding and agreed to proceed.   History of Present Illness: 58 year old male former smoker followed for allergic rhinitis, asthma. Medical history significant for obstructive sleep apnea CPAP intolerant, GERD, hypertension, kidney stone, hepatitis B  Today's tele-visit is for an acute visit.  Patient Complains of 3-4 days of sore throat, runny nose, low grade fever 99.5, cough with thick yellow mucus. Low energy . No body aches. No loss of smell or taste. Appetite is good. No n/v/d. No wheezing. No increased albuterol use.  Spouse is not sick. Works at [employer], works in [employer]. Wears a mask. No known sick contacts.     Observations/Objective: NPSG 06/14/12- AHI 9.5 per hour, moderately loud snoring. Weight 190 pounds  Office spirometry 10/19/16- WNL- FVC 4.46/ 87%, FEV1 3.29/ 84%, ratio 0.74, 25-75% 2.91/ 86%  FENO 10/19/16- 15 (WNL)  Assessment and Plan: URI -appears viral .  Patient does have a fever with upper respiratory infection symptoms.  Will send for COVID-19 testing. Symptomatic care. Doxycycline to have on hold if symptoms worsen with discolored mucus  Plan  Patient Instructions  Mucinex DM twice daily as needed for cough and congestion Claritin 10 mg daily as needed for drainage Saline nasal spray as needed Tylenol as needed for fever Push fluids.  Activity as tolerated Doxycycline to have on hold if symptoms worsen with  discolored mucus. We are sending you for COVID-19 testing Albuterol inhaler as needed for wheezing. Follow-up with Dr. Annamaria Boots as planned and as needed Please contact office for sooner follow up if symptoms do not improve or worsen or seek emergency care      Follow Up Instructions: Follow up as planned with Dr. Annamaria Boots  In 4-6 weeks and As needed   Please contact office for sooner follow up if symptoms do not improve or worsen or seek emergency care     I discussed the assessment and treatment plan with the patient. The patient was provided an opportunity to ask questions and all were answered. The patient agreed with the plan and demonstrated an understanding of the instructions.   The patient was advised to call back or seek an in-person evaluation if the symptoms worsen or if the condition fails to improve as anticipated.  I provided 22 minutes of non-face-to-face time during this encounter.   Rexene Edison, NP

## 2019-05-21 NOTE — Patient Instructions (Addendum)
Mucinex DM twice daily as needed for cough and congestion Claritin 10 mg daily as needed for drainage Saline nasal spray as needed Tylenol as needed for fever Push fluids.  Activity as tolerated Doxycycline to have on hold if symptoms worsen with discolored mucus. We are sending you for COVID-19 testing Albuterol inhaler as needed for wheezing. Follow-up with Dr. Annamaria Boots in 4-6 weeks  and as needed Please contact office for sooner follow up if symptoms do not improve or worsen or seek emergency care        Person Under Monitoring Name: Matthew Richardson  Location: [id number] [place] [address]   Infection Prevention Recommendations for Individuals Confirmed to have, or Being Evaluated for, 2019 Novel Coronavirus (COVID-19) Infection Who Receive Care at Home  Individuals who are confirmed to have, or are being evaluated for, COVID-19 should follow the prevention steps below until a healthcare provider or local or state health department says they can return to normal activities.  Stay home except to get medical care You should restrict activities outside your home, except for getting medical care. Do not go to work, school, or public areas, and do not use public transportation or taxis.  Call ahead before visiting your doctor Before your medical appointment, call the healthcare provider and tell them that you have, or are being evaluated for, COVID-19 infection. This will help the healthcare provider's office take steps to keep other people from getting infected. Ask your healthcare provider to call the local or state health department.  Monitor your symptoms Seek prompt medical attention if your illness is worsening (e.g., difficulty breathing). Before going to your medical appointment, call the healthcare provider and tell them that you have, or are being evaluated for, COVID-19 infection. Ask your healthcare provider to call the local or state health  department.  Wear a facemask You should wear a facemask that covers your nose and mouth when you are in the same room with other people and when you visit a healthcare provider. People who live with or visit you should also wear a facemask while they are in the same room with you.  Separate yourself from other people in your home As much as possible, you should stay in a different room from other people in your home. Also, you should use a separate bathroom, if available.  Avoid sharing household items You should not share dishes, drinking glasses, cups, eating utensils, towels, bedding, or other items with other people in your home. After using these items, you should wash them thoroughly with soap and water.  Cover your coughs and sneezes Cover your mouth and nose with a tissue when you cough or sneeze, or you can cough or sneeze into your sleeve. Throw used tissues in a lined trash can, and immediately wash your hands with soap and water for at least 20 seconds or use an alcohol-based hand rub.  Wash your [facility] your hands often and thoroughly with soap and water for at least 20 seconds. You can use an alcohol-based hand sanitizer if soap and water are not available and if your hands are not visibly dirty. Avoid touching your eyes, nose, and mouth with unwashed hands.   Prevention Steps for Caregivers and Household Members of Individuals Confirmed to have, or Being Evaluated for, COVID-19 Infection Being Cared for in the Home  If you live with, or provide care at home for, a person confirmed to have, or being evaluated for, COVID-19 infection please follow  these guidelines to prevent infection:  Follow healthcare provider's instructions Make sure that you understand and can help the patient follow any healthcare provider instructions for all care.  Provide for the patient's basic needs You should help the patient with basic needs in the home and provide support for getting  groceries, prescriptions, and other personal needs.  Monitor the patient's symptoms If they are getting sicker, call his or her medical provider and tell them that the patient has, or is being evaluated for, COVID-19 infection. This will help the healthcare provider's office take steps to keep other people from getting infected. Ask the healthcare provider to call the local or state health department.  Limit the number of people who have contact with the patient  If possible, have only one caregiver for the patient.  Other household members should stay in another home or place of residence. If this is not possible, they should stay  in another room, or be separated from the patient as much as possible. Use a separate bathroom, if available.  Restrict visitors who do not have an essential need to be in the home.  Keep older adults, very young children, and other sick people away from the patient Keep older adults, very young children, and those who have compromised immune systems or chronic health conditions away from the patient. This includes people with chronic heart, lung, or kidney conditions, diabetes, and cancer.  Ensure good ventilation Make sure that shared spaces in the home have good air flow, such as from an air conditioner or an opened window, weather permitting.  Wash your hands often  Wash your hands often and thoroughly with soap and water for at least 20 seconds. You can use an alcohol based hand sanitizer if soap and water are not available and if your hands are not visibly dirty.  Avoid touching your eyes, nose, and mouth with unwashed hands.  Use disposable paper towels to dry your hands. If not available, use dedicated cloth towels and replace them when they become wet.  Wear a facemask and gloves  Wear a disposable facemask at all times in the room and gloves when you touch or have contact with the patient's blood, body fluids, and/or secretions or excretions,  such as sweat, saliva, sputum, nasal mucus, vomit, urine, or feces.  Ensure the mask fits over your nose and mouth tightly, and do not touch it during use.  Throw out disposable facemasks and gloves after using them. Do not reuse.  Wash your hands immediately after removing your facemask and gloves.  If your personal clothing becomes contaminated, carefully remove clothing and launder. Wash your hands after handling contaminated clothing.  Place all used disposable facemasks, gloves, and other waste in a lined container before disposing them with other household waste.  Remove gloves and wash your hands immediately after handling these items.  Do not share dishes, glasses, or other household items with the patient  Avoid sharing household items. You should not share dishes, drinking glasses, cups, eating utensils, towels, bedding, or other items with a patient who is confirmed to have, or being evaluated for, COVID-19 infection.  After the person uses these items, you should wash them thoroughly with soap and water.  Wash laundry thoroughly  Immediately remove and wash clothes or bedding that have blood, body fluids, and/or secretions or excretions, such as sweat, saliva, sputum, nasal mucus, vomit, urine, or feces, on them.  Wear gloves when handling laundry from the patient.  Read and follow  directions on labels of laundry or clothing items and detergent. In general, wash and dry with the warmest temperatures recommended on the label.  Clean all areas the individual has used often  Clean all touchable surfaces, such as counters, tabletops, doorknobs, bathroom fixtures, toilets, phones, keyboards, tablets, and bedside tables, every day. Also, clean any surfaces that may have blood, body fluids, and/or secretions or excretions on them.  Wear gloves when cleaning surfaces the patient has come in contact with.  Use a diluted bleach solution (e.g., dilute bleach with 1 part bleach and 10  parts water) or a household disinfectant with a label that says EPA-registered for coronaviruses. To make a bleach solution at home, add 1 tablespoon of bleach to 1 quart (4 cups) of water. For a larger supply, add  cup of bleach to 1 gallon (16 cups) of water.  Read labels of cleaning products and follow recommendations provided on product labels. Labels contain instructions for safe and effective use of the cleaning product including precautions you should take when applying the product, such as wearing gloves or eye protection and making sure you have good ventilation during use of the product.  Remove gloves and wash hands immediately after cleaning.  Monitor yourself for signs and symptoms of illness Caregivers and household members are considered close contacts, should monitor their health, and will be asked to limit movement outside of the home to the extent possible. Follow the monitoring steps for close contacts listed on the symptom monitoring form.   ? If you have additional questions, contact your local health department or call the epidemiologist on call at [phone] (available 24/7). ? This guidance is subject to change. For the most up-to-date guidance from [facility], please refer to their website: [email or web address].pl

## 2019-05-22 LAB — NOVEL CORONAVIRUS, NAA: SARS-CoV-2, NAA: NOT DETECTED

## 2019-05-24 ENCOUNTER — Other Ambulatory Visit: Payer: Self-pay | Admitting: Internal Medicine

## 2019-05-24 DIAGNOSIS — K219 Gastro-esophageal reflux disease without esophagitis: Secondary | ICD-10-CM

## 2019-05-27 ENCOUNTER — Encounter: Payer: Self-pay | Admitting: Internal Medicine

## 2019-05-27 ENCOUNTER — Ambulatory Visit: Payer: BC Managed Care – PPO | Admitting: Internal Medicine

## 2019-05-27 ENCOUNTER — Other Ambulatory Visit: Payer: Self-pay

## 2019-05-27 DIAGNOSIS — J4520 Mild intermittent asthma, uncomplicated: Secondary | ICD-10-CM | POA: Diagnosis not present

## 2019-05-27 DIAGNOSIS — Z23 Encounter for immunization: Secondary | ICD-10-CM | POA: Diagnosis not present

## 2019-05-27 DIAGNOSIS — G4733 Obstructive sleep apnea (adult) (pediatric): Secondary | ICD-10-CM | POA: Diagnosis not present

## 2019-05-27 NOTE — Patient Instructions (Signed)
Order- flu vax standard  Please call when you need refills

## 2019-05-27 NOTE — Assessment & Plan Note (Signed)
Very mild in 2013 when he weighed 190 lbs. Didn't tolerate CPAP as available then. Subsequent weight gain. Plan- watch need to restudy.

## 2019-05-27 NOTE — Assessment & Plan Note (Signed)
Asthmatic bronchitis mild intermittent uncomplicated.  Plan- continue inhalers, refill as needed, Flu vax

## 2019-05-27 NOTE — Progress Notes (Signed)
Patient ID: Matthew Richardson, male    DOB: [birth year], 58 y.o.   MRN: [id number]  HPI Male former smoker followed for allergic rhinitis, EIA, OSA/quit CPAP, complicated by kidney stone, GERD, hepatitis B, HBP NPSG 06/14/12- AHI 9.5 per hour, moderately loud snoring. Weight 190 pounds Office spirometry 10/19/16- WNL- FVC 4.46/ 87%, FEV1 3.29/ 84%, ratio 0.74, 25-75% 2.91/ 86% FENO 10/19/16- 15 (WNL)  ---------------------------------------------------------------------   05/24/2018- 68 yoM fomer smoker followed for allergic rhinitis, EIAsthma, OSA/quit CPAP, complicated by kidney stone, Hep B, GERD, HBP -----Asthma: Yearly follow up-continues inhalers Trelegy, albuterol HFA Denies wheezing and has rarely felt need for an inhaler. No allergic nose symptoms of consequence this fall so far.  05/27/2019- 58 yoM fomer smoker followed for allergic rhinitis, EIAsthma, OSA/quit CPAP, complicated by kidney stone, Hep B, GERD, HBP pt states breathing is at baseline; taking Trelegy daily Covid neg 9/29 Televisit 9/20 with NP for viral syndrome URI- given doxy to hold, albuterol hfa, claritin. Now feels recovered from recent illness. Using Trelegy with no need for rescue inhaler. Fall season increased ragweed but no problem.  Working at [employer]. No new issues.  Wants flu shot. Discussed pending Covid vaccine.    Review of Systems-see HPI sign = positive Constitutional:   No-   weight loss, night sweats, fevers, chills, fatigue, lassitude. HEENT:   No-  headaches, difficulty swallowing, tooth/dental problems, sore throat,       No-  sneezing, itching, ear ache, +nasal congestion, post nasal drip,  CV:  No-   chest pain, orthopnea, PND, swelling in lower extremities, anasarca, dizziness, palpitations Resp:  shortness of breath with exertion or at rest.              No-   productive cough,  No non-productive cough,  No-  coughing up of blood.              No-   change in color of mucus.   wheezing.    Skin: No-   rash or lesions. GI:  No-   heartburn, indigestion, abdominal pain, nausea, vomiting,  GU:  MS:  No-   joint pain or swelling.   Neuro- nothing unusual Psych:  No- change in mood or affect. No depression or anxiety.  No memory loss.     Objective:   Physical Exam General- Alert, Oriented, Affect-appropriate, Distress- none acute; sinus bradycardia.  Skin- rash-none, lesions- none, excoriation- none.  Lymphadenopathy- none Head- atraumatic            Eyes- Gross vision intact, PERRLA, conjunctivae clear secretions            Ears- Hearing, canals normal            Nose- Clear, no-Septal dev, mucus, polyps, erosion, perforation             Throat- Mallampati III-IV , mucosa clear , drainage- none, tonsils- atrophic Neck- flexible , trachea midline, no stridor , thyroid nl, carotid no bruit Chest - symmetrical excursion , unlabored           Heart/CV- RRR , no murmur , no gallop  , no rub, nl s1 s2                           - JVD- none , edema- none, stasis changes- none, varices- none           Lung- clear, wheeze- none, cough- none , dullness-none, rub- none  Chest wall-  Abd-  Br/ Gen/ Rectal- Not done, not indicated Extrem- cyanosis- none, clubbing, none, atrophy- none, strength- nl Neuro- grossly intact to observation

## 2019-05-28 ENCOUNTER — Telehealth: Payer: Self-pay | Admitting: Internal Medicine

## 2019-05-28 ENCOUNTER — Other Ambulatory Visit: Payer: Self-pay | Admitting: Internal Medicine

## 2019-05-28 DIAGNOSIS — K219 Gastro-esophageal reflux disease without esophagitis: Secondary | ICD-10-CM

## 2019-05-28 NOTE — Telephone Encounter (Signed)
Medication was denied because pt needs an appointment.   Spoke to pt and informed of same. Pt has scheduled CPE.

## 2019-05-28 NOTE — Telephone Encounter (Signed)
Medication Refill - Medication: omeprazole (PRILOSEC) 40 MG capsule  sildenafil (REVATIO) 20 MG tablet  Has the patient contacted their pharmacy? Yes.   (Agent: If no, request that the patient contact the pharmacy for the refill.) (Agent: If yes, when and what did the pharmacy advise?)  Preferred Pharmacy (with phone number or street name):  [facility] [address], [place] - [place] [phone] (Phone) [phone] (Fax)     Agent: Please be advised that RX refills may take up to 3 business days. We ask that you follow-up with your pharmacy.

## 2019-05-29 ENCOUNTER — Ambulatory Visit (INDEPENDENT_AMBULATORY_CARE_PROVIDER_SITE_OTHER): Payer: BC Managed Care – PPO | Admitting: Internal Medicine

## 2019-05-29 ENCOUNTER — Other Ambulatory Visit (INDEPENDENT_AMBULATORY_CARE_PROVIDER_SITE_OTHER): Payer: BC Managed Care – PPO

## 2019-05-29 ENCOUNTER — Encounter: Payer: Self-pay | Admitting: Internal Medicine

## 2019-05-29 ENCOUNTER — Other Ambulatory Visit: Payer: Self-pay | Admitting: Internal Medicine

## 2019-05-29 ENCOUNTER — Other Ambulatory Visit: Payer: Self-pay

## 2019-05-29 VITALS — BP 140/90 | HR 60 | Temp 98.5°F | Resp 16 | Ht 71.0 in | Wt 207.0 lb

## 2019-05-29 DIAGNOSIS — R9431 Abnormal electrocardiogram [ECG] [EKG]: Secondary | ICD-10-CM | POA: Diagnosis not present

## 2019-05-29 DIAGNOSIS — R739 Hyperglycemia, unspecified: Secondary | ICD-10-CM | POA: Insufficient documentation

## 2019-05-29 DIAGNOSIS — Z0001 Encounter for general adult medical examination with abnormal findings: Secondary | ICD-10-CM | POA: Diagnosis not present

## 2019-05-29 DIAGNOSIS — Z8619 Personal history of other infectious and parasitic diseases: Secondary | ICD-10-CM | POA: Diagnosis not present

## 2019-05-29 DIAGNOSIS — N5201 Erectile dysfunction due to arterial insufficiency: Secondary | ICD-10-CM

## 2019-05-29 DIAGNOSIS — Z125 Encounter for screening for malignant neoplasm of prostate: Secondary | ICD-10-CM | POA: Diagnosis not present

## 2019-05-29 DIAGNOSIS — K219 Gastro-esophageal reflux disease without esophagitis: Secondary | ICD-10-CM

## 2019-05-29 DIAGNOSIS — I10 Essential (primary) hypertension: Secondary | ICD-10-CM

## 2019-05-29 DIAGNOSIS — Z0000 Encounter for general adult medical examination without abnormal findings: Secondary | ICD-10-CM

## 2019-05-29 DIAGNOSIS — E559 Vitamin D deficiency, unspecified: Secondary | ICD-10-CM

## 2019-05-29 LAB — LIPID PANEL
Cholesterol: 211 mg/dL — ABNORMAL HIGH (ref 0–200)
HDL: 61.3 mg/dL (ref >39.00)
LDL Cholesterol: 135 mg/dL — ABNORMAL HIGH (ref 0–99)
NonHDL: 149.41
Total CHOL/HDL Ratio: 3
Triglycerides: 72 mg/dL (ref 0.0–149.0)
VLDL: 14.4 mg/dL (ref 0.0–40.0)

## 2019-05-29 LAB — BASIC METABOLIC PANEL
BUN: 18 mg/dL (ref 6–23)
CO2: 29 mEq/L (ref 19–32)
Calcium: 9.1 mg/dL (ref 8.4–10.5)
Chloride: 103 mEq/L (ref 96–112)
Creatinine, Ser: 0.93 mg/dL (ref 0.40–1.50)
GFR: 83.74 mL/min (ref >60.00)
Glucose, Bld: 121 mg/dL — ABNORMAL HIGH (ref 70–99)
Potassium: 4.5 mEq/L (ref 3.5–5.1)
Sodium: 140 mEq/L (ref 135–145)

## 2019-05-29 LAB — TROPONIN I (HIGH SENSITIVITY): High Sens Troponin I: 7 ng/L (ref 2–17)

## 2019-05-29 LAB — CBC WITH DIFFERENTIAL/PLATELET
Basophils Absolute: 0 10*3/uL (ref 0.0–0.1)
Basophils Relative: 0.4 % (ref 0.0–3.0)
Eosinophils Absolute: 0.2 10*3/uL (ref 0.0–0.7)
Eosinophils Relative: 3.4 % (ref 0.0–5.0)
HCT: 41.9 % (ref 39.0–52.0)
Hemoglobin: 14.8 g/dL (ref 13.0–17.0)
Lymphocytes Relative: 25.4 % (ref 12.0–46.0)
Lymphs Abs: 1.7 10*3/uL (ref 0.7–4.0)
MCHC: 35.3 g/dL (ref 30.0–36.0)
MCV: 87.5 fl (ref 78.0–100.0)
Monocytes Absolute: 0.4 10*3/uL (ref 0.1–1.0)
Monocytes Relative: 6.1 % (ref 3.0–12.0)
Neutro Abs: 4.3 10*3/uL (ref 1.4–7.7)
Neutrophils Relative %: 64.7 % (ref 43.0–77.0)
Platelets: 198 10*3/uL (ref 150.0–400.0)
RBC: 4.79 Mil/uL (ref 4.22–5.81)
RDW: 12.9 % (ref 11.5–15.5)
WBC: 6.6 10*3/uL (ref 4.0–10.5)

## 2019-05-29 LAB — URINALYSIS, ROUTINE W REFLEX MICROSCOPIC
Bilirubin Urine: NEGATIVE
Ketones, ur: NEGATIVE
Leukocytes,Ua: NEGATIVE
Nitrite: NEGATIVE
Specific Gravity, Urine: 1.02 (ref 1.000–1.030)
Total Protein, Urine: NEGATIVE
Urine Glucose: NEGATIVE
Urobilinogen, UA: 0.2 (ref 0.0–1.0)
pH: 6 (ref 5.0–8.0)

## 2019-05-29 LAB — HEPATIC FUNCTION PANEL
ALT: 12 U/L (ref 0–53)
AST: 14 U/L (ref 0–37)
Albumin: 4.1 g/dL (ref 3.5–5.2)
Alkaline Phosphatase: 65 U/L (ref 39–117)
Bilirubin, Direct: 0.1 mg/dL (ref 0.0–0.3)
Total Bilirubin: 0.5 mg/dL (ref 0.2–1.2)
Total Protein: 6.5 g/dL (ref 6.0–8.3)

## 2019-05-29 LAB — TSH: TSH: 1.17 u[IU]/mL (ref 0.35–4.50)

## 2019-05-29 LAB — PSA: PSA: 1.42 ng/mL (ref 0.10–4.00)

## 2019-05-29 LAB — VITAMIN D 25 HYDROXY (VIT D DEFICIENCY, FRACTURES): VITD: 25.92 ng/mL — ABNORMAL LOW (ref 30.00–100.00)

## 2019-05-29 LAB — HEMOGLOBIN A1C: Hgb A1c MFr Bld: 5.7 % (ref 4.6–6.5)

## 2019-05-29 MED ORDER — SILDENAFIL CITRATE 20 MG PO TABS: 80.0000 mg | ORAL_TABLET | Freq: Every day | ORAL | 5 refills | Status: DC | PRN

## 2019-05-29 MED ORDER — CHOLECALCIFEROL 50 MCG (2000 UT) PO TABS: 1.0000 | ORAL_TABLET | Freq: Every day | ORAL | 1 refills | Status: AC

## 2019-05-29 MED ORDER — OMEPRAZOLE 40 MG PO CPDR: 40.0000 mg | DELAYED_RELEASE_CAPSULE | Freq: Every day | ORAL | 1 refills | Status: DC

## 2019-05-29 NOTE — Progress Notes (Signed)
Subjective:  Patient ID: Matthew Richardson, male    DOB: 04/06/1961  Age: 58 y.o. MRN: [id number]  CC: Hypertension, Annual Exam, and Gastroesophageal Reflux   HPI HALEN KILBY presents for a CPX.  He is very active and denies any recent episodes of CP, DOE, diaphoresis, palpitations, edema, or fatigue.  He tells me his heartburn is well controlled with the PPI.  He denies any recent episodes of odynophagia, dysphagia, early satiety, or melena.  Outpatient Medications Prior to Visit  Medication Sig Dispense Refill   albuterol (PROVENTIL HFA;VENTOLIN HFA) 108 (90 Base) MCG/ACT inhaler INHALE 2 PUFFS BY MOUTH EVERY 6 HOURS AS NEEDED FOR WHEEZING OR SHORTNESS OF BREATH 18 each 6   TRELEGY ELLIPTA 100-62.5-25 MCG/INH AEPB      doxycycline (VIBRA-TABS) 100 MG tablet Take 1 tablet (100 mg total) by mouth 2 (two) times daily. 14 tablet 0   omeprazole (PRILOSEC) 40 MG capsule Take 1 capsule by mouth once daily 90 capsule 1   sildenafil (REVATIO) 20 MG tablet Take 4 tablets (80 mg total) by mouth daily as needed. 60 tablet 5   No facility-administered medications prior to visit.     ROS Review of Systems  Constitutional: Positive for unexpected weight change (wt gain). Negative for appetite change, diaphoresis and fatigue.  HENT: Negative.  Negative for sore throat, trouble swallowing and voice change.   Eyes: Negative.   Respiratory: Negative for cough, chest tightness, shortness of breath and wheezing.   Cardiovascular: Negative for chest pain, palpitations and leg swelling.  Gastrointestinal: Negative for abdominal pain, blood in stool, constipation, diarrhea, nausea and vomiting.  Endocrine: Negative.   Genitourinary: Negative.  Negative for difficulty urinating, dysuria, hematuria, penile swelling, scrotal swelling and testicular pain.       +ED  Musculoskeletal: Negative.  Negative for arthralgias and myalgias.  Skin: Negative.  Negative for color change and pallor.    Neurological: Negative.  Negative for weakness, light-headedness and headaches.  Hematological: Negative for adenopathy. Does not bruise/bleed easily.  Psychiatric/Behavioral: Negative.     Objective:  BP 140/90 (BP Location: Left Arm, Patient Position: Sitting, Cuff Size: Large)    Pulse 60    Temp 98.5 F (36.9 C) (Oral)    Resp 16    Ht 5\' 11"  (1.803 m)    Wt 207 lb (93.9 kg)    SpO2 96%    BMI 28.87 kg/m   BP Readings from Last 3 Encounters:  05/29/19 140/90  05/27/19 (!) 144/98  06/19/18 138/88    Wt Readings from Last 3 Encounters:  05/29/19 207 lb (93.9 kg)  05/27/19 208 lb 3.2 oz (94.4 kg)  06/19/18 202 lb 12 oz (92 kg)    Physical Exam Vitals signs reviewed.  Constitutional:      Appearance: Normal appearance.  HENT:     Nose: Nose normal.     Mouth/Throat:     Mouth: Mucous membranes are moist.  Eyes:     General: No scleral icterus.    Conjunctiva/sclera: Conjunctivae normal.  Neck:     Musculoskeletal: Normal range of motion and neck supple.  Cardiovascular:     Rate and Rhythm: Normal rate and regular rhythm.     Pulses: Normal pulses.     Heart sounds: No murmur.     Comments: EKG -----  Sinus  Bradycardia  -  T-abnormality  - Anterolateral ischemia.   ABNORMAL - new findings compared to EKG from 3 years ago  Pulmonary:  Effort: Pulmonary effort is normal.     Breath sounds: No stridor. No wheezing, rhonchi or rales.  Abdominal:     General: Abdomen is flat. Bowel sounds are normal. There is no distension.     Palpations: Abdomen is soft. There is no hepatomegaly or splenomegaly.     Tenderness: There is no abdominal tenderness.     Hernia: No hernia is present. There is no hernia in the left inguinal area or right inguinal area.  Genitourinary:    Pubic Area: No rash.      Penis: Normal. No discharge, swelling or lesions.      Scrotum/Testes: Normal.        Right: Mass or tenderness not present.        Left: Mass or tenderness not  present.     Epididymis:     Right: Normal. Not inflamed or enlarged. No mass.     Left: Not inflamed or enlarged. No mass.     Prostate: Normal. Not enlarged, not tender and no nodules present.     Rectum: Normal. Guaiac result negative. No mass, tenderness, anal fissure, external hemorrhoid or internal hemorrhoid. Normal anal tone.  Musculoskeletal:     Right lower leg: No edema.     Left lower leg: No edema.  Lymphadenopathy:     Cervical: No cervical adenopathy.     Lower Body: No right inguinal adenopathy. No left inguinal adenopathy.  Skin:    General: Skin is warm and dry.  Neurological:     General: No focal deficit present.     Mental Status: He is alert.  Psychiatric:        Mood and Affect: Mood normal.     Lab Results  Component Value Date   WBC 6.6 05/29/2019   HGB 14.8 05/29/2019   HCT 41.9 05/29/2019   PLT 198.0 05/29/2019   GLUCOSE 121 (H) 05/29/2019   CHOL 211 (H) 05/29/2019   TRIG 72.0 05/29/2019   HDL 61.30 05/29/2019   LDLDIRECT 136.5 03/13/2013   LDLCALC 135 (H) 05/29/2019   ALT 12 05/29/2019   AST 14 05/29/2019   NA 140 05/29/2019   K 4.5 05/29/2019   CL 103 05/29/2019   CREATININE 0.93 05/29/2019   BUN 18 05/29/2019   CO2 29 05/29/2019   TSH 1.17 05/29/2019   PSA 1.42 05/29/2019   INR 1.1 RATIO (H) 07/18/2008   HGBA1C 5.7 05/29/2019    Ct Angio Chest Aorta W/cm &/or Wo/cm  Result Date: 10/19/2017 CLINICAL DATA:  Family history of aneurysm. Abdominal mass, non pulsatile, palpable. History of hernia. EXAM: CT ANGIOGRAPHY CHEST, ABDOMEN AND PELVIS TECHNIQUE: Multidetector CT imaging through the chest, abdomen and pelvis was performed using the standard protocol during bolus administration of intravenous contrast. Multiplanar reconstructed images and MIPs were obtained and reviewed to evaluate the vascular anatomy. CONTRAST:  100 cc Isovue 370 intravenous COMPARISON:  Abdominal CT 07/12/2013 FINDINGS: CTA CHEST FINDINGS Cardiovascular: Normal heart  size. No pericardial effusion. No atheromatous changes seen along the aorta. Negative for dissection. There is no aortic aneurysm. Aortic diameters: Valve annulus 22 mm Sinuses of Valsalva 39 mm Sino-tubular junction 31 mm Ascending segment 33 mm Arch 26 mm Descending segment 28 mm Mediastinum/Nodes: Negative for adenopathy. Lungs/Pleura: There is no edema, consolidation, effusion, or pneumothorax. Musculoskeletal: Spondylosis. Glenohumeral osteoarthritis with subchondral cysts. Review of the MIP images confirms the above findings. CTA ABDOMEN AND PELVIS FINDINGS VASCULAR Aorta: Normal diameter. Atheromatous wall thickening and calcification of the infrarenal aorta. No  dissection or inflammatory changes. Celiac: Patent without evidence of aneurysm, dissection, vasculitis or significant stenosis. SMA: Patent without evidence of aneurysm, dissection, vasculitis or significant stenosis. Renals: Duplicated left renal arteries. Vessels are smooth and diffusely patent. IMA: Patent Inflow: Patent without evidence of aneurysm, dissection, vasculitis or significant stenosis. Veins: No obvious venous abnormality within the limitations of this arterial phase study. Review of the MIP images confirms the above findings. NON-VASCULAR Hepatobiliary: Cyst in the left liver. Negative gallbladder and biliary tree. Pancreas: Negative Spleen: Heterogeneous density attributed to contrast timing. Adrenals/Urinary Tract: Symmetric normal renal enhancement. Negative adrenal glands. No hydronephrosis or urolithiasis. Urinary bladder is unremarkable. Stomach/Bowel: Negative. Lymphatic: Negative for adenopathy. Reproductive: Nonspecific prostate calcifications. Other: Fatty umbilical hernia that is small and nonprogressive when compared to prior. Fatty enlargement of the left inguinal canal. Musculoskeletal: No acute or aggressive finding. Review of the MIP images confirms the above findings. IMPRESSION: 1. Negative for aortic aneurysm. 2.  Overall mild aortic atherosclerosis seen distally. No visceral stenosis. 3. Fatty umbilical hernia without progression from 2014. Electronically Signed   By: Monte Fantasia M.D.   On: 10/19/2017 16:28   Ct Angio Abd/pel W/ And/or W/o  Result Date: 10/19/2017 CLINICAL DATA:  Family history of aneurysm. Abdominal mass, non pulsatile, palpable. History of hernia. EXAM: CT ANGIOGRAPHY CHEST, ABDOMEN AND PELVIS TECHNIQUE: Multidetector CT imaging through the chest, abdomen and pelvis was performed using the standard protocol during bolus administration of intravenous contrast. Multiplanar reconstructed images and MIPs were obtained and reviewed to evaluate the vascular anatomy. CONTRAST:  100 cc Isovue 370 intravenous COMPARISON:  Abdominal CT 07/12/2013 FINDINGS: CTA CHEST FINDINGS Cardiovascular: Normal heart size. No pericardial effusion. No atheromatous changes seen along the aorta. Negative for dissection. There is no aortic aneurysm. Aortic diameters: Valve annulus 22 mm Sinuses of Valsalva 39 mm Sino-tubular junction 31 mm Ascending segment 33 mm Arch 26 mm Descending segment 28 mm Mediastinum/Nodes: Negative for adenopathy. Lungs/Pleura: There is no edema, consolidation, effusion, or pneumothorax. Musculoskeletal: Spondylosis. Glenohumeral osteoarthritis with subchondral cysts. Review of the MIP images confirms the above findings. CTA ABDOMEN AND PELVIS FINDINGS VASCULAR Aorta: Normal diameter. Atheromatous wall thickening and calcification of the infrarenal aorta. No dissection or inflammatory changes. Celiac: Patent without evidence of aneurysm, dissection, vasculitis or significant stenosis. SMA: Patent without evidence of aneurysm, dissection, vasculitis or significant stenosis. Renals: Duplicated left renal arteries. Vessels are smooth and diffusely patent. IMA: Patent Inflow: Patent without evidence of aneurysm, dissection, vasculitis or significant stenosis. Veins: No obvious venous abnormality within  the limitations of this arterial phase study. Review of the MIP images confirms the above findings. NON-VASCULAR Hepatobiliary: Cyst in the left liver. Negative gallbladder and biliary tree. Pancreas: Negative Spleen: Heterogeneous density attributed to contrast timing. Adrenals/Urinary Tract: Symmetric normal renal enhancement. Negative adrenal glands. No hydronephrosis or urolithiasis. Urinary bladder is unremarkable. Stomach/Bowel: Negative. Lymphatic: Negative for adenopathy. Reproductive: Nonspecific prostate calcifications. Other: Fatty umbilical hernia that is small and nonprogressive when compared to prior. Fatty enlargement of the left inguinal canal. Musculoskeletal: No acute or aggressive finding. Review of the MIP images confirms the above findings. IMPRESSION: 1. Negative for aortic aneurysm. 2. Overall mild aortic atherosclerosis seen distally. No visceral stenosis. 3. Fatty umbilical hernia without progression from 2014. Electronically Signed   By: Monte Fantasia M.D.   On: 10/19/2017 16:28    Assessment & Plan:   Keontay was seen today for hypertension, annual exam and gastroesophageal reflux.  Diagnoses and all orders for this visit:  Essential hypertension-  He has stage I hypertension.  I will treat the vitamin D deficiency.  He has subtle changes on his EKG so I have asked him to undergo an echocardiogram.  His labs are otherwise negative for secondary causes or endorgan damage.  He is not willing to take an antihypertensive but does agree to improve his lifestyle modifications. -     CBC with Differential/Platelet; Future -     Basic metabolic panel; Future -     TSH; Future -     Urinalysis, Routine w reflex microscopic; Future -     VITAMIN D 25 Hydroxy (Vit-D Deficiency, Fractures); Future -     EKG 12-Lead  Routine general medical examination at a health care facility - Exam completed, labs reviewed, vaccines reviewed, colon cancer screening is up-to-date, patient education  was given. -     PSA; Future  History of hepatitis B virus infection- His LFTs and hep B surface antigen are negative.  The infection has resolved. -     Lipid panel; Future -     Hepatic function panel; Future -     Hepatitis B surface antigen; Future  Hyperglycemia- He has mild prediabetes.  He will work on his lifestyle modifications. -     Basic metabolic panel; Future -     Hemoglobin A1c; Future  GERD without esophagitis -     omeprazole (PRILOSEC) 40 MG capsule; Take 1 capsule (40 mg total) by mouth daily.  Abnormal electrocardiogram (ECG) (EKG) -     Troponin I (High Sensitivity); Future -     ECHOCARDIOGRAM COMPLETE; Future  Erectile dysfunction due to arterial insufficiency -     sildenafil (REVATIO) 20 MG tablet; Take 4 tablets (80 mg total) by mouth daily as needed.  Vitamin D deficiency -     Cholecalciferol 50 MCG (2000 UT) TABS; Take 1 tablet (2,000 Units total) by mouth daily.   I have discontinued Roselind Messier. Mclees's doxycycline. I have also changed his omeprazole. Additionally, I am having him start on Cholecalciferol. Lastly, I am having him maintain his albuterol, Trelegy Ellipta, and sildenafil.  Meds ordered this encounter  Medications   omeprazole (PRILOSEC) 40 MG capsule    Sig: Take 1 capsule (40 mg total) by mouth daily.    Dispense:  90 capsule    Refill:  1   sildenafil (REVATIO) 20 MG tablet    Sig: Take 4 tablets (80 mg total) by mouth daily as needed.    Dispense:  60 tablet    Refill:  5   Cholecalciferol 50 MCG (2000 UT) TABS    Sig: Take 1 tablet (2,000 Units total) by mouth daily.    Dispense:  90 tablet    Refill:  1     Follow-up: Return in about 3 months (around 08/29/2019).  Scarlette Calico, MD

## 2019-05-29 NOTE — Patient Instructions (Signed)

## 2019-05-30 ENCOUNTER — Encounter: Payer: Self-pay | Admitting: Internal Medicine

## 2019-05-30 LAB — HEPATITIS B SURFACE ANTIGEN: Hepatitis B Surface Ag: NONREACTIVE

## 2019-06-14 ENCOUNTER — Ambulatory Visit (HOSPITAL_COMMUNITY): Payer: BC Managed Care – PPO | Attending: Internal Medicine

## 2019-06-14 ENCOUNTER — Other Ambulatory Visit: Payer: Self-pay

## 2019-06-14 DIAGNOSIS — R9431 Abnormal electrocardiogram [ECG] [EKG]: Secondary | ICD-10-CM | POA: Diagnosis not present

## 2019-06-15 ENCOUNTER — Encounter: Payer: Self-pay | Admitting: Internal Medicine

## 2019-06-15 ENCOUNTER — Other Ambulatory Visit: Payer: Self-pay | Admitting: Internal Medicine

## 2019-06-15 DIAGNOSIS — N5201 Erectile dysfunction due to arterial insufficiency: Secondary | ICD-10-CM

## 2019-06-17 ENCOUNTER — Other Ambulatory Visit: Payer: Self-pay | Admitting: Internal Medicine

## 2019-06-17 DIAGNOSIS — I5189 Other ill-defined heart diseases: Secondary | ICD-10-CM | POA: Insufficient documentation

## 2019-06-17 DIAGNOSIS — I519 Heart disease, unspecified: Secondary | ICD-10-CM

## 2019-06-17 DIAGNOSIS — I10 Essential (primary) hypertension: Secondary | ICD-10-CM

## 2019-06-17 MED ORDER — IRBESARTAN 150 MG PO TABS: 150.0000 mg | ORAL_TABLET | Freq: Every day | ORAL | 0 refills | Status: DC

## 2019-07-19 ENCOUNTER — Other Ambulatory Visit: Payer: BC Managed Care – PPO

## 2019-09-18 ENCOUNTER — Ambulatory Visit: Payer: BC Managed Care – PPO | Admitting: Internal Medicine

## 2019-09-18 ENCOUNTER — Encounter: Payer: Self-pay | Admitting: Internal Medicine

## 2019-09-18 ENCOUNTER — Telehealth: Payer: Self-pay

## 2019-09-18 ENCOUNTER — Other Ambulatory Visit: Payer: Self-pay

## 2019-09-18 VITALS — BP 130/82 | HR 64 | Temp 98.3°F | Ht 71.0 in | Wt 209.0 lb

## 2019-09-18 DIAGNOSIS — I5189 Other ill-defined heart diseases: Secondary | ICD-10-CM

## 2019-09-18 DIAGNOSIS — K429 Umbilical hernia without obstruction or gangrene: Secondary | ICD-10-CM | POA: Diagnosis not present

## 2019-09-18 DIAGNOSIS — I10 Essential (primary) hypertension: Secondary | ICD-10-CM | POA: Diagnosis not present

## 2019-09-18 DIAGNOSIS — I519 Heart disease, unspecified: Secondary | ICD-10-CM | POA: Diagnosis not present

## 2019-09-18 LAB — BASIC METABOLIC PANEL
BUN: 20 mg/dL (ref 6–23)
CO2: 29 mEq/L (ref 19–32)
Calcium: 9.2 mg/dL (ref 8.4–10.5)
Chloride: 105 mEq/L (ref 96–112)
Creatinine, Ser: 0.87 mg/dL (ref 0.40–1.50)
GFR: 90.35 mL/min (ref >60.00)
Glucose, Bld: 105 mg/dL — ABNORMAL HIGH (ref 70–99)
Potassium: 4.3 mEq/L (ref 3.5–5.1)
Sodium: 139 mEq/L (ref 135–145)

## 2019-09-18 MED ORDER — IRBESARTAN 150 MG PO TABS: 150.0000 mg | ORAL_TABLET | Freq: Every day | ORAL | 1 refills | Status: DC

## 2019-09-18 NOTE — Telephone Encounter (Signed)
New message     1. Which medications need to be refilled? (please list name of each medication and dose if known)  patient does not know the name of blood pressure medication   2. Which pharmacy/location (including street and city if local pharmacy) is medication to be sent to? [facility] on [address] in [place]   3. Do they need a 30 day or 90 day supply? 90 day supply

## 2019-09-18 NOTE — Progress Notes (Signed)
Subjective:  Patient ID: Matthew Richardson, male    DOB: Dec 26, 1960  Age: 59 y.o. MRN: [id number]  CC: Hypertension  This visit occurred during the SARS-CoV-2 public health emergency.  Safety protocols were in place, including screening questions prior to the visit, additional usage of staff PPE, and extensive cleaning of exam room while observing appropriate contact time as indicated for disinfecting solutions.   HPI Matthew Richardson presents for f/up - He tells me his blood pressure has been well controlled.  He is active and denies any recent episodes of chest pain, shortness of breath, palpitations, edema, or fatigue.  He is tolerating the ARB well.  Since I last saw him he has had an echocardiogram which showed that he has grade 1 diastolic dysfunction.  Outpatient Medications Prior to Visit  Medication Sig Dispense Refill  . Cholecalciferol 50 MCG (2000 UT) TABS Take 1 tablet (2,000 Units total) by mouth daily. 90 tablet 1  . omeprazole (PRILOSEC) 40 MG capsule Take 1 capsule (40 mg total) by mouth daily. 90 capsule 1  . sildenafil (REVATIO) 20 MG tablet TAKE FOUR TABLETS BY MOUTH DAILY AS NEEDED 60 tablet 5  . TRELEGY ELLIPTA 100-62.5-25 MCG/INH AEPB     . irbesartan (AVAPRO) 150 MG tablet Take 1 tablet (150 mg total) by mouth daily. 90 tablet 0  . albuterol (PROVENTIL HFA;VENTOLIN HFA) 108 (90 Base) MCG/ACT inhaler INHALE 2 PUFFS BY MOUTH EVERY 6 HOURS AS NEEDED FOR WHEEZING OR SHORTNESS OF BREATH (Patient not taking: Reported on 09/18/2019) 18 each 6   No facility-administered medications prior to visit.    ROS Review of Systems  Constitutional: Negative for diaphoresis, fatigue and unexpected weight change.  HENT: Negative.   Eyes: Negative for visual disturbance.  Respiratory: Negative for cough, chest tightness, shortness of breath and wheezing.   Cardiovascular: Negative for chest pain, palpitations and leg swelling.  Gastrointestinal: Negative for abdominal pain, constipation,  diarrhea, nausea and vomiting.       He complains that his umbilical hernia is getting larger.  Endocrine: Negative.   Genitourinary: Negative.  Negative for difficulty urinating.  Musculoskeletal: Negative for arthralgias and myalgias.  Skin: Negative.  Negative for color change.  Neurological: Negative.  Negative for dizziness, weakness, light-headedness and headaches.  Hematological: Negative for adenopathy. Does not bruise/bleed easily.  Psychiatric/Behavioral: Negative.     Objective:  BP 130/82 (BP Location: Left Arm, Patient Position: Sitting, Cuff Size: Normal)   Pulse 64   Temp 98.3 F (36.8 C) (Oral)   Ht 5\' 11"  (1.803 m)   Wt 209 lb (94.8 kg)   SpO2 97%   BMI 29.15 kg/m   BP Readings from Last 3 Encounters:  09/18/19 130/82  05/29/19 140/90  05/27/19 (!) 144/98    Wt Readings from Last 3 Encounters:  09/18/19 209 lb (94.8 kg)  05/29/19 207 lb (93.9 kg)  05/27/19 208 lb 3.2 oz (94.4 kg)    Physical Exam Vitals reviewed.  Constitutional:      Appearance: Normal appearance.  HENT:     Nose: Nose normal.     Mouth/Throat:     Mouth: Mucous membranes are moist.  Eyes:     General: No scleral icterus.    Conjunctiva/sclera: Conjunctivae normal.  Cardiovascular:     Rate and Rhythm: Normal rate and regular rhythm.     Heart sounds: No murmur.  Pulmonary:     Effort: Pulmonary effort is normal.     Breath sounds: No stridor. No wheezing  or rales.  Abdominal:     General: Abdomen is flat. Bowel sounds are normal. There is no distension.     Palpations: There is no hepatomegaly, splenomegaly or mass.     Tenderness: There is no abdominal tenderness.     Hernia: A hernia is present. Hernia is present in the umbilical area.  Musculoskeletal:        General: Normal range of motion.     Cervical back: Neck supple.     Right lower leg: No edema.     Left lower leg: No edema.  Lymphadenopathy:     Cervical: No cervical adenopathy.  Skin:    General: Skin is  warm and dry.     Coloration: Skin is not pale.  Neurological:     General: No focal deficit present.     Mental Status: He is alert.  Psychiatric:        Mood and Affect: Mood normal.        Behavior: Behavior normal.     Lab Results  Component Value Date   WBC 6.6 05/29/2019   HGB 14.8 05/29/2019   HCT 41.9 05/29/2019   PLT 198.0 05/29/2019   GLUCOSE 105 (H) 09/18/2019   CHOL 211 (H) 05/29/2019   TRIG 72.0 05/29/2019   HDL 61.30 05/29/2019   LDLDIRECT 136.5 03/13/2013   LDLCALC 135 (H) 05/29/2019   ALT 12 05/29/2019   AST 14 05/29/2019   NA 139 09/18/2019   K 4.3 09/18/2019   CL 105 09/18/2019   CREATININE 0.87 09/18/2019   BUN 20 09/18/2019   CO2 29 09/18/2019   TSH 1.17 05/29/2019   PSA 1.42 05/29/2019   INR 1.1 RATIO (H) 07/18/2008   HGBA1C 5.7 05/29/2019    CT ANGIO CHEST AORTA W/CM &/OR WO/CM  Result Date: 10/19/2017 CLINICAL DATA:  Family history of aneurysm. Abdominal mass, non pulsatile, palpable. History of hernia. EXAM: CT ANGIOGRAPHY CHEST, ABDOMEN AND PELVIS TECHNIQUE: Multidetector CT imaging through the chest, abdomen and pelvis was performed using the standard protocol during bolus administration of intravenous contrast. Multiplanar reconstructed images and MIPs were obtained and reviewed to evaluate the vascular anatomy. CONTRAST:  100 cc Isovue 370 intravenous COMPARISON:  Abdominal CT 07/12/2013 FINDINGS: CTA CHEST FINDINGS Cardiovascular: Normal heart size. No pericardial effusion. No atheromatous changes seen along the aorta. Negative for dissection. There is no aortic aneurysm. Aortic diameters: Valve annulus 22 mm Sinuses of Valsalva 39 mm Sino-tubular junction 31 mm Ascending segment 33 mm Arch 26 mm Descending segment 28 mm Mediastinum/Nodes: Negative for adenopathy. Lungs/Pleura: There is no edema, consolidation, effusion, or pneumothorax. Musculoskeletal: Spondylosis. Glenohumeral osteoarthritis with subchondral cysts. Review of the MIP images  confirms the above findings. CTA ABDOMEN AND PELVIS FINDINGS VASCULAR Aorta: Normal diameter. Atheromatous wall thickening and calcification of the infrarenal aorta. No dissection or inflammatory changes. Celiac: Patent without evidence of aneurysm, dissection, vasculitis or significant stenosis. SMA: Patent without evidence of aneurysm, dissection, vasculitis or significant stenosis. Renals: Duplicated left renal arteries. Vessels are smooth and diffusely patent. IMA: Patent Inflow: Patent without evidence of aneurysm, dissection, vasculitis or significant stenosis. Veins: No obvious venous abnormality within the limitations of this arterial phase study. Review of the MIP images confirms the above findings. NON-VASCULAR Hepatobiliary: Cyst in the left liver. Negative gallbladder and biliary tree. Pancreas: Negative Spleen: Heterogeneous density attributed to contrast timing. Adrenals/Urinary Tract: Symmetric normal renal enhancement. Negative adrenal glands. No hydronephrosis or urolithiasis. Urinary bladder is unremarkable. Stomach/Bowel: Negative. Lymphatic: Negative for adenopathy. Reproductive:  Nonspecific prostate calcifications. Other: Fatty umbilical hernia that is small and nonprogressive when compared to prior. Fatty enlargement of the left inguinal canal. Musculoskeletal: No acute or aggressive finding. Review of the MIP images confirms the above findings. IMPRESSION: 1. Negative for aortic aneurysm. 2. Overall mild aortic atherosclerosis seen distally. No visceral stenosis. 3. Fatty umbilical hernia without progression from 2014. Electronically Signed   By: Monte Fantasia M.D.   On: 10/19/2017 16:28   CT Angio Abd/Pel w/ and/or w/o  Result Date: 10/19/2017 CLINICAL DATA:  Family history of aneurysm. Abdominal mass, non pulsatile, palpable. History of hernia. EXAM: CT ANGIOGRAPHY CHEST, ABDOMEN AND PELVIS TECHNIQUE: Multidetector CT imaging through the chest, abdomen and pelvis was performed using  the standard protocol during bolus administration of intravenous contrast. Multiplanar reconstructed images and MIPs were obtained and reviewed to evaluate the vascular anatomy. CONTRAST:  100 cc Isovue 370 intravenous COMPARISON:  Abdominal CT 07/12/2013 FINDINGS: CTA CHEST FINDINGS Cardiovascular: Normal heart size. No pericardial effusion. No atheromatous changes seen along the aorta. Negative for dissection. There is no aortic aneurysm. Aortic diameters: Valve annulus 22 mm Sinuses of Valsalva 39 mm Sino-tubular junction 31 mm Ascending segment 33 mm Arch 26 mm Descending segment 28 mm Mediastinum/Nodes: Negative for adenopathy. Lungs/Pleura: There is no edema, consolidation, effusion, or pneumothorax. Musculoskeletal: Spondylosis. Glenohumeral osteoarthritis with subchondral cysts. Review of the MIP images confirms the above findings. CTA ABDOMEN AND PELVIS FINDINGS VASCULAR Aorta: Normal diameter. Atheromatous wall thickening and calcification of the infrarenal aorta. No dissection or inflammatory changes. Celiac: Patent without evidence of aneurysm, dissection, vasculitis or significant stenosis. SMA: Patent without evidence of aneurysm, dissection, vasculitis or significant stenosis. Renals: Duplicated left renal arteries. Vessels are smooth and diffusely patent. IMA: Patent Inflow: Patent without evidence of aneurysm, dissection, vasculitis or significant stenosis. Veins: No obvious venous abnormality within the limitations of this arterial phase study. Review of the MIP images confirms the above findings. NON-VASCULAR Hepatobiliary: Cyst in the left liver. Negative gallbladder and biliary tree. Pancreas: Negative Spleen: Heterogeneous density attributed to contrast timing. Adrenals/Urinary Tract: Symmetric normal renal enhancement. Negative adrenal glands. No hydronephrosis or urolithiasis. Urinary bladder is unremarkable. Stomach/Bowel: Negative. Lymphatic: Negative for adenopathy. Reproductive:  Nonspecific prostate calcifications. Other: Fatty umbilical hernia that is small and nonprogressive when compared to prior. Fatty enlargement of the left inguinal canal. Musculoskeletal: No acute or aggressive finding. Review of the MIP images confirms the above findings. IMPRESSION: 1. Negative for aortic aneurysm. 2. Overall mild aortic atherosclerosis seen distally. No visceral stenosis. 3. Fatty umbilical hernia without progression from 2014. Electronically Signed   By: Monte Fantasia M.D.   On: 10/19/2017 16:28    Assessment & Plan:   Matthew Richardson was seen today for hypertension.  Diagnoses and all orders for this visit:  Essential hypertension- His blood pressure is adequately well controlled.  Electrolytes and renal function are normal. -     Basic metabolic panel  Umbilical hernia without obstruction and without gangrene -     Ambulatory referral to General Surgery  Grade I diastolic dysfunction- He has a normal volume status.  He will continue to work on his lifestyle modifications.  Will continue to maintain control of his blood pressure.   I am having Matthew Messier. Richardson maintain his albuterol, Trelegy Ellipta, omeprazole, Cholecalciferol, and sildenafil.  No orders of the defined types were placed in this encounter.    Follow-up: Return in about 6 months (around 03/17/2020).  Scarlette Calico, MD

## 2019-09-18 NOTE — Patient Instructions (Signed)
Umbilical Hernia, Adult  A hernia is a bulge of tissue that pushes through an opening between muscles. An umbilical hernia happens in the abdomen, near the belly button (umbilicus). The hernia may contain tissues from the small intestine, large intestine, or fatty tissue covering the intestines (omentum). Umbilical hernias in adults tend to get worse over time, and they require surgical treatment. There are several types of umbilical hernias. You may have:  A hernia located just above or below the umbilicus (indirect hernia). This is the most common type of umbilical hernia in adults.  A hernia that forms through an opening formed by the umbilicus (direct hernia).  A hernia that comes and goes (reducible hernia). A reducible hernia may be visible only when you strain, lift something heavy, or cough. This type of hernia can be pushed back into the abdomen (reduced).  A hernia that traps abdominal tissue inside the hernia (incarcerated hernia). This type of hernia cannot be reduced.  A hernia that cuts off blood flow to the tissues inside the hernia (strangulated hernia). The tissues can start to die if this happens. This type of hernia requires emergency treatment. What are the causes? An umbilical hernia happens when tissue inside the abdomen presses on a weak area of the abdominal muscles. What increases the risk? You may have a greater risk of this condition if you:  Are obese.  Have had several pregnancies.  Have a buildup of fluid inside your abdomen (ascites).  Have had surgery that weakens the abdominal muscles. What are the signs or symptoms? The main symptom of this condition is a painless bulge at or near the belly button. A reducible hernia may be visible only when you strain, lift something heavy, or cough. Other symptoms may include:  Dull pain.  A feeling of pressure. Symptoms of a strangulated hernia may include:  Pain that gets increasingly worse.  Nausea and  vomiting.  Pain when pressing on the hernia.  Skin over the hernia becoming red or purple.  Constipation.  Blood in the stool. How is this diagnosed? This condition may be diagnosed based on:  A physical exam. You may be asked to cough or strain while standing. These actions increase the pressure inside your abdomen and force the hernia through the opening in your muscles. Your health care provider may try to reduce the hernia by pressing on it.  Your symptoms and medical history. How is this treated? Surgery is the only treatment for an umbilical hernia. Surgery for a strangulated hernia is done as soon as possible. If you have a small hernia that is not incarcerated, you may need to lose weight before having surgery. Follow these instructions at home:  Lose weight, if told by your health care provider.  Do not try to push the hernia back in.  Watch your hernia for any changes in color or size. Tell your health care provider if any changes occur.  You may need to avoid activities that increase pressure on your hernia.  Do not lift anything that is heavier than 10 lb (4.5 kg) until your health care provider says that this is safe.  Take over-the-counter and prescription medicines only as told by your health care provider.  Keep all follow-up visits as told by your health care provider. This is important. Contact a health care provider if:  Your hernia gets larger.  Your hernia becomes painful. Get help right away if:  You develop sudden, severe pain near the area of your hernia.    You have pain as well as nausea or vomiting.  You have pain and the skin over your hernia changes color.  You develop a fever. This information is not intended to replace advice given to you by your health care provider. Make sure you discuss any questions you have with your health care provider. Document Revised: 09/20/2017 Document Reviewed: 02/06/2017 Elsevier Patient Education  2020  [employer]  

## 2019-09-19 ENCOUNTER — Encounter: Payer: Self-pay | Admitting: Internal Medicine

## 2019-11-11 ENCOUNTER — Other Ambulatory Visit: Payer: Self-pay | Admitting: Internal Medicine

## 2019-11-11 DIAGNOSIS — K219 Gastro-esophageal reflux disease without esophagitis: Secondary | ICD-10-CM

## 2019-11-20 ENCOUNTER — Telehealth: Payer: Self-pay

## 2019-11-20 NOTE — Telephone Encounter (Signed)
Pt contacted and he stated that he had heard about his result. Informed of normal results. Pt stated that he did not need the results mailed to him now.   Pt will let me know if he needs assistance getting into his mychart account.

## 2019-11-20 NOTE — Telephone Encounter (Signed)
New message    The patient calling regarding lab results mailed to his home.   The patient stated he has spoken to someone last week regarding this request has not seen anything in the mail.

## 2020-01-28 ENCOUNTER — Other Ambulatory Visit: Payer: Self-pay | Admitting: Internal Medicine

## 2020-01-28 DIAGNOSIS — K219 Gastro-esophageal reflux disease without esophagitis: Secondary | ICD-10-CM

## 2020-02-13 ENCOUNTER — Other Ambulatory Visit: Payer: Self-pay | Admitting: Internal Medicine

## 2020-03-23 ENCOUNTER — Other Ambulatory Visit: Payer: Self-pay | Admitting: Internal Medicine

## 2020-03-23 DIAGNOSIS — I5189 Other ill-defined heart diseases: Secondary | ICD-10-CM

## 2020-03-23 DIAGNOSIS — I10 Essential (primary) hypertension: Secondary | ICD-10-CM

## 2020-03-23 DIAGNOSIS — I519 Heart disease, unspecified: Secondary | ICD-10-CM

## 2020-04-28 ENCOUNTER — Other Ambulatory Visit: Payer: Self-pay | Admitting: Internal Medicine

## 2020-04-28 DIAGNOSIS — K219 Gastro-esophageal reflux disease without esophagitis: Secondary | ICD-10-CM

## 2020-05-06 ENCOUNTER — Ambulatory Visit: Payer: BC Managed Care – PPO | Admitting: Internal Medicine

## 2020-05-19 ENCOUNTER — Encounter: Payer: Self-pay | Admitting: Internal Medicine

## 2020-05-19 ENCOUNTER — Other Ambulatory Visit: Payer: Self-pay

## 2020-05-19 ENCOUNTER — Ambulatory Visit: Payer: BC Managed Care – PPO | Admitting: Internal Medicine

## 2020-05-19 VITALS — BP 126/82 | HR 55 | Temp 98.1°F | Resp 16 | Ht 71.0 in | Wt 197.0 lb

## 2020-05-19 DIAGNOSIS — I10 Essential (primary) hypertension: Secondary | ICD-10-CM | POA: Diagnosis not present

## 2020-05-19 DIAGNOSIS — K429 Umbilical hernia without obstruction or gangrene: Secondary | ICD-10-CM

## 2020-05-19 DIAGNOSIS — N5201 Erectile dysfunction due to arterial insufficiency: Secondary | ICD-10-CM | POA: Diagnosis not present

## 2020-05-19 MED ORDER — SILDENAFIL CITRATE 20 MG PO TABS: ORAL_TABLET | ORAL | 5 refills | Status: DC

## 2020-05-19 NOTE — Patient Instructions (Signed)

## 2020-05-19 NOTE — Progress Notes (Signed)
Subjective:  Patient ID: Matthew Richardson, male    DOB: 1961-06-30  Age: 59 y.o. MRN: [id number]  CC: Hypertension  This visit occurred during the SARS-CoV-2 public health emergency.  Safety protocols were in place, including screening questions prior to the visit, additional usage of staff PPE, and extensive cleaning of exam room while observing appropriate contact time as indicated for disinfecting solutions.    HPI Matthew Richardson presents for f/up -he continues to be concerned about an umbilical hernia.  I previously saw him and referred him to general surgery.  He was also given the phone number of a Education officer, environmental.  He has not seen the general surgeon yet and needs another referral.  He denies abdominal pain, nausea, vomiting, diarrhea, or cramping.  He tells me his blood pressure is well controlled.  He is very active and denies CP, DOE, palpitations, edema, or fatigue.  Outpatient Medications Prior to Visit  Medication Sig Dispense Refill  . albuterol (PROVENTIL HFA;VENTOLIN HFA) 108 (90 Base) MCG/ACT inhaler INHALE 2 PUFFS BY MOUTH EVERY 6 HOURS AS NEEDED FOR WHEEZING OR SHORTNESS OF BREATH 18 each 6  . Cholecalciferol 50 MCG (2000 UT) TABS Take 1 tablet (2,000 Units total) by mouth daily. 90 tablet 1  . irbesartan (AVAPRO) 150 MG tablet Take 1 tablet by mouth once daily 90 tablet 0  . omeprazole (PRILOSEC) 40 MG capsule Take 1 capsule by mouth once daily 90 capsule 0  . TRELEGY ELLIPTA 100-62.5-25 MCG/INH AEPB INHALE 1 PUFF INTO THE LUNGS DAILY 60 each 3  . sildenafil (REVATIO) 20 MG tablet TAKE FOUR TABLETS BY MOUTH DAILY AS NEEDED 60 tablet 5   No facility-administered medications prior to visit.    ROS Review of Systems  Constitutional: Negative.  Negative for appetite change, diaphoresis and fatigue.  HENT: Negative.   Eyes: Negative for visual disturbance.  Respiratory: Negative for cough, chest tightness, shortness of breath and wheezing.   Cardiovascular: Negative for  chest pain, palpitations and leg swelling.  Gastrointestinal: Negative for abdominal pain, constipation, diarrhea, nausea and vomiting.  Endocrine: Negative.   Genitourinary: Negative.  Negative for difficulty urinating and dysuria.  Musculoskeletal: Negative for arthralgias and myalgias.  Skin: Negative for color change and pallor.  Neurological: Negative.  Negative for dizziness, weakness, light-headedness and headaches.  Hematological: Negative for adenopathy. Does not bruise/bleed easily.  Psychiatric/Behavioral: Negative.     Objective:  BP 126/82   Pulse (!) 55   Temp 98.1 F (36.7 C) (Oral)   Resp 16   Ht 5\' 11"  (1.803 m)   Wt 197 lb (89.4 kg)   SpO2 98%   BMI 27.48 kg/m   BP Readings from Last 3 Encounters:  05/19/20 126/82  09/18/19 130/82  05/29/19 140/90    Wt Readings from Last 3 Encounters:  05/19/20 197 lb (89.4 kg)  09/18/19 209 lb (94.8 kg)  05/29/19 207 lb (93.9 kg)    Physical Exam Vitals reviewed.  Constitutional:      Appearance: Normal appearance.  HENT:     Nose: Nose normal.     Mouth/Throat:     Mouth: Mucous membranes are moist.  Eyes:     General: No scleral icterus.    Conjunctiva/sclera: Conjunctivae normal.  Cardiovascular:     Rate and Rhythm: Normal rate and regular rhythm.     Heart sounds: No murmur heard.   Pulmonary:     Effort: Pulmonary effort is normal.     Breath sounds: No stridor. No  wheezing, rhonchi or rales.  Abdominal:     General: Bowel sounds are normal. There is no distension.     Palpations: Abdomen is soft.     Tenderness: There is no abdominal tenderness.     Hernia: A hernia is present. Hernia is present in the umbilical area.  Musculoskeletal:        General: Normal range of motion.     Cervical back: Neck supple.     Right lower leg: No edema.     Left lower leg: No edema.  Lymphadenopathy:     Cervical: No cervical adenopathy.  Skin:    General: Skin is warm and dry.  Neurological:     General:  No focal deficit present.     Mental Status: He is alert.  Psychiatric:        Mood and Affect: Mood normal.        Behavior: Behavior normal.     Lab Results  Component Value Date   WBC 6.4 05/19/2020   HGB 15.0 05/19/2020   HCT 43.9 05/19/2020   PLT 204 05/19/2020   GLUCOSE 97 05/19/2020   CHOL 211 (H) 05/29/2019   TRIG 72.0 05/29/2019   HDL 61.30 05/29/2019   LDLDIRECT 136.5 03/13/2013   LDLCALC 135 (H) 05/29/2019   ALT 12 05/29/2019   AST 14 05/29/2019   NA 140 05/19/2020   K 4.8 05/19/2020   CL 104 05/19/2020   CREATININE 1.04 05/19/2020   BUN 22 05/19/2020   CO2 28 05/19/2020   TSH 1.17 05/29/2019   PSA 1.42 05/29/2019   INR 1.1 RATIO (H) 07/18/2008   HGBA1C 5.7 05/29/2019    CT ANGIO CHEST AORTA W/CM &/OR WO/CM  Result Date: 10/19/2017 CLINICAL DATA:  Family history of aneurysm. Abdominal mass, non pulsatile, palpable. History of hernia. EXAM: CT ANGIOGRAPHY CHEST, ABDOMEN AND PELVIS TECHNIQUE: Multidetector CT imaging through the chest, abdomen and pelvis was performed using the standard protocol during bolus administration of intravenous contrast. Multiplanar reconstructed images and MIPs were obtained and reviewed to evaluate the vascular anatomy. CONTRAST:  100 cc Isovue 370 intravenous COMPARISON:  Abdominal CT 07/12/2013 FINDINGS: CTA CHEST FINDINGS Cardiovascular: Normal heart size. No pericardial effusion. No atheromatous changes seen along the aorta. Negative for dissection. There is no aortic aneurysm. Aortic diameters: Valve annulus 22 mm Sinuses of Valsalva 39 mm Sino-tubular junction 31 mm Ascending segment 33 mm Arch 26 mm Descending segment 28 mm Mediastinum/Nodes: Negative for adenopathy. Lungs/Pleura: There is no edema, consolidation, effusion, or pneumothorax. Musculoskeletal: Spondylosis. Glenohumeral osteoarthritis with subchondral cysts. Review of the MIP images confirms the above findings. CTA ABDOMEN AND PELVIS FINDINGS VASCULAR Aorta: Normal  diameter. Atheromatous wall thickening and calcification of the infrarenal aorta. No dissection or inflammatory changes. Celiac: Patent without evidence of aneurysm, dissection, vasculitis or significant stenosis. SMA: Patent without evidence of aneurysm, dissection, vasculitis or significant stenosis. Renals: Duplicated left renal arteries. Vessels are smooth and diffusely patent. IMA: Patent Inflow: Patent without evidence of aneurysm, dissection, vasculitis or significant stenosis. Veins: No obvious venous abnormality within the limitations of this arterial phase study. Review of the MIP images confirms the above findings. NON-VASCULAR Hepatobiliary: Cyst in the left liver. Negative gallbladder and biliary tree. Pancreas: Negative Spleen: Heterogeneous density attributed to contrast timing. Adrenals/Urinary Tract: Symmetric normal renal enhancement. Negative adrenal glands. No hydronephrosis or urolithiasis. Urinary bladder is unremarkable. Stomach/Bowel: Negative. Lymphatic: Negative for adenopathy. Reproductive: Nonspecific prostate calcifications. Other: Fatty umbilical hernia that is small and nonprogressive when compared to  prior. Fatty enlargement of the left inguinal canal. Musculoskeletal: No acute or aggressive finding. Review of the MIP images confirms the above findings. IMPRESSION: 1. Negative for aortic aneurysm. 2. Overall mild aortic atherosclerosis seen distally. No visceral stenosis. 3. Fatty umbilical hernia without progression from 2014. Electronically Signed   By: Monte Fantasia M.D.   On: 10/19/2017 16:28   CT Angio Abd/Pel w/ and/or w/o  Result Date: 10/19/2017 CLINICAL DATA:  Family history of aneurysm. Abdominal mass, non pulsatile, palpable. History of hernia. EXAM: CT ANGIOGRAPHY CHEST, ABDOMEN AND PELVIS TECHNIQUE: Multidetector CT imaging through the chest, abdomen and pelvis was performed using the standard protocol during bolus administration of intravenous contrast. Multiplanar  reconstructed images and MIPs were obtained and reviewed to evaluate the vascular anatomy. CONTRAST:  100 cc Isovue 370 intravenous COMPARISON:  Abdominal CT 07/12/2013 FINDINGS: CTA CHEST FINDINGS Cardiovascular: Normal heart size. No pericardial effusion. No atheromatous changes seen along the aorta. Negative for dissection. There is no aortic aneurysm. Aortic diameters: Valve annulus 22 mm Sinuses of Valsalva 39 mm Sino-tubular junction 31 mm Ascending segment 33 mm Arch 26 mm Descending segment 28 mm Mediastinum/Nodes: Negative for adenopathy. Lungs/Pleura: There is no edema, consolidation, effusion, or pneumothorax. Musculoskeletal: Spondylosis. Glenohumeral osteoarthritis with subchondral cysts. Review of the MIP images confirms the above findings. CTA ABDOMEN AND PELVIS FINDINGS VASCULAR Aorta: Normal diameter. Atheromatous wall thickening and calcification of the infrarenal aorta. No dissection or inflammatory changes. Celiac: Patent without evidence of aneurysm, dissection, vasculitis or significant stenosis. SMA: Patent without evidence of aneurysm, dissection, vasculitis or significant stenosis. Renals: Duplicated left renal arteries. Vessels are smooth and diffusely patent. IMA: Patent Inflow: Patent without evidence of aneurysm, dissection, vasculitis or significant stenosis. Veins: No obvious venous abnormality within the limitations of this arterial phase study. Review of the MIP images confirms the above findings. NON-VASCULAR Hepatobiliary: Cyst in the left liver. Negative gallbladder and biliary tree. Pancreas: Negative Spleen: Heterogeneous density attributed to contrast timing. Adrenals/Urinary Tract: Symmetric normal renal enhancement. Negative adrenal glands. No hydronephrosis or urolithiasis. Urinary bladder is unremarkable. Stomach/Bowel: Negative. Lymphatic: Negative for adenopathy. Reproductive: Nonspecific prostate calcifications. Other: Fatty umbilical hernia that is small and  nonprogressive when compared to prior. Fatty enlargement of the left inguinal canal. Musculoskeletal: No acute or aggressive finding. Review of the MIP images confirms the above findings. IMPRESSION: 1. Negative for aortic aneurysm. 2. Overall mild aortic atherosclerosis seen distally. No visceral stenosis. 3. Fatty umbilical hernia without progression from 2014. Electronically Signed   By: Monte Fantasia M.D.   On: 10/19/2017 16:28    Assessment & Plan:   Davonne was seen today for hypertension.  Diagnoses and all orders for this visit:  Essential hypertension- His blood pressure is well controlled.  Electrolytes and renal function are normal.  Will continue the current dose of the ARB. -     CBC with Differential/Platelet; Future -     BASIC METABOLIC PANEL WITH GFR; Future -     BASIC METABOLIC PANEL WITH GFR -     CBC with Differential/Platelet  Erectile dysfunction due to arterial insufficiency -     sildenafil (REVATIO) 20 MG tablet; TAKE FOUR TABLETS BY MOUTH DAILY AS NEEDED  Umbilical hernia without obstruction and without gangrene -     Ambulatory referral to General Surgery   I am having Roselind Messier. Mccombie maintain his albuterol, Cholecalciferol, Trelegy Ellipta, irbesartan, omeprazole, and sildenafil.  Meds ordered this encounter  Medications  . sildenafil (REVATIO) 20 MG tablet  Sig: TAKE FOUR TABLETS BY MOUTH DAILY AS NEEDED    Dispense:  60 tablet    Refill:  5     Follow-up: Return in about 3 months (around 08/18/2020).  Scarlette Calico, MD

## 2020-05-20 ENCOUNTER — Telehealth: Payer: Self-pay | Admitting: Emergency Medicine

## 2020-05-20 DIAGNOSIS — N5201 Erectile dysfunction due to arterial insufficiency: Secondary | ICD-10-CM

## 2020-05-20 LAB — BASIC METABOLIC PANEL WITH GFR
BUN: 22 mg/dL (ref 7–25)
CO2: 28 mmol/L (ref 20–32)
Calcium: 9.2 mg/dL (ref 8.6–10.3)
Chloride: 104 mmol/L (ref 98–110)
Creat: 1.04 mg/dL (ref 0.70–1.33)
GFR, Est African American: 92 mL/min/{1.73_m2} (ref >=60)
GFR, Est Non African American: 79 mL/min/{1.73_m2} (ref >=60)
Glucose, Bld: 97 mg/dL (ref 65–99)
Potassium: 4.8 mmol/L (ref 3.5–5.3)
Sodium: 140 mmol/L (ref 135–146)

## 2020-05-20 LAB — CBC WITH DIFFERENTIAL/PLATELET
Absolute Monocytes: 493 cells/uL (ref 200–950)
Basophils Absolute: 19 cells/uL (ref 0–200)
Basophils Relative: 0.3 %
Eosinophils Absolute: 154 cells/uL (ref 15–500)
Eosinophils Relative: 2.4 %
HCT: 43.9 % (ref 38.5–50.0)
Hemoglobin: 15 g/dL (ref 13.2–17.1)
Lymphs Abs: 1715 cells/uL (ref 850–3900)
MCH: 30.8 pg (ref 27.0–33.0)
MCHC: 34.2 g/dL (ref 32.0–36.0)
MCV: 90.1 fL (ref 80.0–100.0)
MPV: 9.1 fL (ref 7.5–12.5)
Monocytes Relative: 7.7 %
Neutro Abs: 4019 cells/uL (ref 1500–7800)
Neutrophils Relative %: 62.8 %
Platelets: 204 10*3/uL (ref 140–400)
RBC: 4.87 10*6/uL (ref 4.20–5.80)
RDW: 13.1 % (ref 11.0–15.0)
Total Lymphocyte: 26.8 %
WBC: 6.4 10*3/uL (ref 3.8–10.8)

## 2020-05-20 MED ORDER — SILDENAFIL CITRATE 20 MG PO TABS: ORAL_TABLET | ORAL | 5 refills | Status: DC

## 2020-05-20 NOTE — Telephone Encounter (Signed)
Pt called and stated he would like his sildenafil (REVATIO) 20 MG tablet sent to [facility] in [place] instead of [place] [facility]. Please let patient know when switch has been made thanks.

## 2020-05-20 NOTE — Telephone Encounter (Signed)
Med sent.  Attempted to call pt, VM is full, can not receive any messages at this time.

## 2020-05-20 NOTE — Addendum Note (Signed)
Addended by: Hinda Kehr on: 05/20/2020 11:38 AM   Modules accepted: Orders

## 2020-05-28 ENCOUNTER — Other Ambulatory Visit: Payer: Self-pay

## 2020-05-28 ENCOUNTER — Ambulatory Visit: Payer: BC Managed Care – PPO | Admitting: Internal Medicine

## 2020-05-28 ENCOUNTER — Encounter: Payer: Self-pay | Admitting: Internal Medicine

## 2020-05-28 DIAGNOSIS — J4520 Mild intermittent asthma, uncomplicated: Secondary | ICD-10-CM

## 2020-05-28 DIAGNOSIS — I700 Atherosclerosis of aorta: Secondary | ICD-10-CM | POA: Diagnosis not present

## 2020-05-28 NOTE — Progress Notes (Signed)
Patient ID: Matthew Richardson, male    DOB: [birth year], 59 y.o.   MRN: [id number]  HPI Male former smoker followed for allergic rhinitis, EIA, OSA/quit CPAP, complicated by kidney stone, GERD, hepatitis B, HBP NPSG 06/14/12- AHI 9.5 per hour, moderately loud snoring. Weight 190 pounds Office spirometry 10/19/16- WNL- FVC 4.46/ 87%, FEV1 3.29/ 84%, ratio 0.74, 25-75% 2.91/ 86% FENO 10/19/16- 15 (WNL)  ---------------------------------------------------------------------   05/27/2019- 58 yoM fomer smoker followed for allergic rhinitis, EIAsthma, OSA/quit CPAP, complicated by kidney stone, Hep B, GERD, HBP pt states breathing is at baseline; taking Trelegy daily Covid neg 9/29 Televisit 9/20 with NP for viral syndrome URI- given doxy to hold, albuterol hfa, claritin. Now feels recovered from recent illness. Using Trelegy with no need for rescue inhaler. Fall season increased ragweed but no problem.  Working at [employer]. No new issues.  Wants flu shot. Discussed pending Covid vaccine.    05/28/20- 48 yoM fomer smoker followed for allergic rhinitis, EIAsthma, OSA/quit CPAP, complicated by kidney stone, Hep B, GERD, HTN, Atherosclerosis, Gr1DD, Albuterol hfa, Trelegy 100,  Still working at [employer]- none - discussed and recommended Flu vax- will get at [facility] well controlled with Trelegy. Rarely needs rescue inhaler.   Review of Systems-see HPI sign = positive Constitutional:   No-   weight loss, night sweats, fevers, chills, fatigue, lassitude. HEENT:   No-  headaches, difficulty swallowing, tooth/dental problems, sore throat,       No-  sneezing, itching, ear ache, +nasal congestion, post nasal drip,  CV:  No-   chest pain, orthopnea, PND, swelling in lower extremities, anasarca, dizziness, palpitations Resp:  shortness of breath with exertion or at rest.              No-   productive cough,  No non-productive cough,  No-  coughing up of blood.              No-   change in  color of mucus.   wheezing.   Skin: No-   rash or lesions. GI:  No-   heartburn, indigestion, abdominal pain, nausea, vomiting,  GU:  MS:  No-   joint pain or swelling.   Neuro- nothing unusual Psych:  No- change in mood or affect. No depression or anxiety.  No memory loss.     Objective:   Physical Exam General- Alert, Oriented, Affect-appropriate, Distress- none acute; sinus bradycardia.  Skin- rash-none, lesions- none, excoriation- none.  Lymphadenopathy- none Head- atraumatic            Eyes- Gross vision intact, PERRLA, conjunctivae clear secretions            Ears- Hearing, canals normal            Nose- Clear, no-Septal dev, mucus, polyps, erosion, perforation             Throat- Mallampati III-IV , mucosa clear , drainage- none, tonsils- atrophic Neck- flexible , trachea midline, no stridor , thyroid nl, carotid no bruit Chest - symmetrical excursion , unlabored           Heart/CV- RRR , no murmur , no gallop  , no rub, nl s1 s2                           - JVD- none , edema- none, stasis changes- none, varices- none           Lung- clear, wheeze- none, cough-  none , dullness-none, rub- none           Chest wall-  Abd-  Br/ Gen/ Rectal- Not done, not indicated Extrem- cyanosis- none, clubbing, none, atrophy- none, strength- nl Neuro- grossly intact to observation

## 2020-05-28 NOTE — Patient Instructions (Signed)
Ok to continue current meds- let us know if you need refills  Please call if we can help

## 2020-05-31 NOTE — Assessment & Plan Note (Signed)
ASCVD and HTN managed by his PCP.

## 2020-05-31 NOTE — Assessment & Plan Note (Signed)
Remains well-controlled and with no recent complication. Plan- Call for refills as needed. I did encourage Covid vaccine.

## 2020-06-09 ENCOUNTER — Telehealth: Payer: Self-pay | Admitting: Internal Medicine

## 2020-06-09 NOTE — Telephone Encounter (Signed)
Patient would like his lab results from his last appointment with Dr. Ronnald Ramp (9.28.21) mailed to him:  [facility] [address] [place] [address]

## 2020-06-09 NOTE — Telephone Encounter (Signed)
Lab results have been mailed.

## 2020-06-12 ENCOUNTER — Encounter: Payer: Self-pay | Admitting: Internal Medicine

## 2020-06-27 ENCOUNTER — Other Ambulatory Visit: Payer: Self-pay | Admitting: Internal Medicine

## 2020-06-27 DIAGNOSIS — I10 Essential (primary) hypertension: Secondary | ICD-10-CM

## 2020-06-27 DIAGNOSIS — I5189 Other ill-defined heart diseases: Secondary | ICD-10-CM

## 2020-07-13 ENCOUNTER — Other Ambulatory Visit: Payer: Self-pay | Admitting: Internal Medicine

## 2020-07-13 DIAGNOSIS — K219 Gastro-esophageal reflux disease without esophagitis: Secondary | ICD-10-CM

## 2020-09-28 ENCOUNTER — Other Ambulatory Visit: Payer: Self-pay | Admitting: Internal Medicine

## 2020-09-28 DIAGNOSIS — I10 Essential (primary) hypertension: Secondary | ICD-10-CM

## 2020-09-28 DIAGNOSIS — I5189 Other ill-defined heart diseases: Secondary | ICD-10-CM

## 2020-09-28 DIAGNOSIS — K219 Gastro-esophageal reflux disease without esophagitis: Secondary | ICD-10-CM

## 2020-10-07 ENCOUNTER — Telehealth: Payer: Self-pay | Admitting: Internal Medicine

## 2020-10-07 MED ORDER — DOXYCYCLINE HYCLATE 100 MG PO TABS: 100.0000 mg | ORAL_TABLET | Freq: Two times a day (BID) | ORAL | 0 refills | Status: DC

## 2020-10-07 NOTE — Telephone Encounter (Signed)
Please order doxycycline 100 mg, # 14, 1 twice daily

## 2020-10-07 NOTE — Telephone Encounter (Addendum)
Called and spoke with patient to get his permission to talk to his wife Suanne Marker as she is not listed on the [place]. He gave permission and stated he would update at next OV. Called and spoke with patient's wife Suanne Marker who states that patient is having symptoms of nasal congestion, pressure, sneezing, watery eyes,mostly dry cough but sometimes productive with yellow sputum, and headache. Fever of 99.4. Symptoms started 2 days ago. Had Covid 19 test today and it was negative.   Dr. Annamaria Boots please advise

## 2020-10-07 NOTE — Telephone Encounter (Signed)
Called and spoke with patients wife Suanne Marker to let her know that we were going to send in prescription. She verified pharmacy. RX has been sent. Nothing further needed at this time.

## 2020-10-10 ENCOUNTER — Other Ambulatory Visit (HOSPITAL_COMMUNITY)
Admission: RE | Admit: 2020-10-10 | Discharge: 2020-10-10 | Disposition: A | Discharge status: Home / Self Care | Payer: BC Managed Care – PPO | Source: Ambulatory Visit | Attending: Surgery | Admitting: Surgery

## 2020-10-10 DIAGNOSIS — Z20822 Contact with and (suspected) exposure to covid-19: Secondary | ICD-10-CM | POA: Diagnosis not present

## 2020-10-10 DIAGNOSIS — Z01812 Encounter for preprocedural laboratory examination: Secondary | ICD-10-CM | POA: Insufficient documentation

## 2020-10-10 LAB — SARS CORONAVIRUS 2 (TAT 6-24 HRS): SARS Coronavirus 2: NEGATIVE

## 2020-10-12 ENCOUNTER — Other Ambulatory Visit: Payer: Self-pay

## 2020-10-12 ENCOUNTER — Encounter (HOSPITAL_COMMUNITY): Payer: Self-pay | Admitting: Surgery

## 2020-10-12 ENCOUNTER — Ambulatory Visit: Payer: Self-pay | Admitting: Surgery

## 2020-10-12 NOTE — Progress Notes (Signed)
Patient denies shortness of breath, fever, cough or chest pain.  PCP - Dr Scarlette Calico Cardiologist - n/a Pulmonology - Dr Baird Lyons  Chest x-ray - n/a EKG - DOS 10/14/20 Stress Test - n/a ECHO - 06/14/19 Cardiac Cath - n/a  Sleep Study -  Yes CPAP - does not use cpap  STOP now taking any Aspirin (unless otherwise instructed by your surgeon), Aleve, Naproxen, Ibuprofen, Motrin, Advil, Goody's, BC's, all herbal medications, fish oil, and all vitamins.   Coronavirus Screening Covid test on 10/10/20 was negative.  Patient verbalized understanding of instructions that were given via phone.

## 2020-10-13 NOTE — Anesthesia Preprocedure Evaluation (Addendum)
Anesthesia Evaluation  Patient identified by MRN, date of birth, ID band Patient awake    Reviewed: Allergy & Precautions, NPO status , Patient's Chart, lab work & pertinent test results  History of Anesthesia Complications (+) PONV  Airway Mallampati: II  TM Distance: >3 FB     Dental  (+) Dental Advisory Given   Pulmonary asthma , sleep apnea , former smoker,    breath sounds clear to auscultation       Cardiovascular hypertension,  Rhythm:Regular Rate:Normal     Neuro/Psych negative neurological ROS     GI/Hepatic Neg liver ROS, GERD  ,  Endo/Other  negative endocrine ROS  Renal/GU negative Renal ROS     Musculoskeletal   Abdominal   Peds  Hematology negative hematology ROS (+)   Anesthesia Other Findings   Reproductive/Obstetrics                            Lab Results  Component Value Date   WBC 6.4 05/19/2020   HGB 15.0 05/19/2020   HCT 43.9 05/19/2020   MCV 90.1 05/19/2020   PLT 204 05/19/2020   Lab Results  Component Value Date   CREATININE 1.04 05/19/2020   BUN 22 05/19/2020   NA 140 05/19/2020   K 4.8 05/19/2020   CL 104 05/19/2020   CO2 28 05/19/2020    Anesthesia Physical Anesthesia Plan  ASA: II  Anesthesia Plan: General   Post-op Pain Management:    Induction: Intravenous  PONV Risk Score and Plan: 2 and Dexamethasone, Ondansetron and Treatment may vary due to age or medical condition  Airway Management Planned: LMA and Oral ETT  Additional Equipment:   Intra-op Plan:   Post-operative Plan: Extubation in OR  Informed Consent: I have reviewed the patients History and Physical, chart, labs and discussed the procedure including the risks, benefits and alternatives for the proposed anesthesia with the patient or authorized representative who has indicated his/her understanding and acceptance.     Dental advisory given  Plan Discussed with:  CRNA  Anesthesia Plan Comments:        Anesthesia Quick Evaluation

## 2020-10-14 ENCOUNTER — Other Ambulatory Visit: Payer: Self-pay

## 2020-10-14 ENCOUNTER — Ambulatory Visit (HOSPITAL_COMMUNITY): Payer: BC Managed Care – PPO | Admitting: Anesthesiology

## 2020-10-14 ENCOUNTER — Encounter (HOSPITAL_COMMUNITY)
Admission: RE | Disposition: A | Discharge status: Home / Self Care | Payer: Self-pay | Source: Home / Self Care | Attending: Surgery

## 2020-10-14 ENCOUNTER — Encounter (HOSPITAL_COMMUNITY): Payer: Self-pay | Admitting: Surgery

## 2020-10-14 ENCOUNTER — Ambulatory Visit (HOSPITAL_COMMUNITY)
Admission: RE | Admit: 2020-10-14 | Discharge: 2020-10-14 | Disposition: A | Discharge status: Home / Self Care | Payer: BC Managed Care – PPO | Attending: Surgery | Admitting: Surgery

## 2020-10-14 DIAGNOSIS — K429 Umbilical hernia without obstruction or gangrene: Secondary | ICD-10-CM | POA: Diagnosis not present

## 2020-10-14 DIAGNOSIS — Z880 Allergy status to penicillin: Secondary | ICD-10-CM | POA: Diagnosis not present

## 2020-10-14 DIAGNOSIS — Z882 Allergy status to sulfonamides status: Secondary | ICD-10-CM | POA: Insufficient documentation

## 2020-10-14 DIAGNOSIS — Z79899 Other long term (current) drug therapy: Secondary | ICD-10-CM | POA: Diagnosis not present

## 2020-10-14 DIAGNOSIS — Z87891 Personal history of nicotine dependence: Secondary | ICD-10-CM | POA: Diagnosis not present

## 2020-10-14 DIAGNOSIS — Z881 Allergy status to other antibiotic agents status: Secondary | ICD-10-CM | POA: Insufficient documentation

## 2020-10-14 DIAGNOSIS — Z7951 Long term (current) use of inhaled steroids: Secondary | ICD-10-CM | POA: Insufficient documentation

## 2020-10-14 HISTORY — DX: Personal history of urinary calculi: Z87.442

## 2020-10-14 HISTORY — DX: Other specified postprocedural states: Z98.890

## 2020-10-14 HISTORY — DX: Other specified postprocedural states: R11.2

## 2020-10-14 HISTORY — PX: UMBILICAL HERNIA REPAIR: SHX196

## 2020-10-14 HISTORY — DX: Sleep apnea, unspecified: G47.30

## 2020-10-14 HISTORY — DX: Inflammatory liver disease, unspecified: K75.9

## 2020-10-14 LAB — COMPREHENSIVE METABOLIC PANEL
ALT: 18 U/L (ref 0–44)
AST: 20 U/L (ref 15–41)
Albumin: 3.5 g/dL (ref 3.5–5.0)
Alkaline Phosphatase: 46 U/L (ref 38–126)
Anion gap: 10 (ref 5–15)
BUN: 11 mg/dL (ref 6–20)
CO2: 25 mmol/L (ref 22–32)
Calcium: 8.6 mg/dL — ABNORMAL LOW (ref 8.9–10.3)
Chloride: 104 mmol/L (ref 98–111)
Creatinine, Ser: 0.81 mg/dL (ref 0.61–1.24)
GFR, Estimated: >60 mL/min (ref >60)
Glucose, Bld: 125 mg/dL — ABNORMAL HIGH (ref 70–99)
Potassium: 4 mmol/L (ref 3.5–5.1)
Sodium: 139 mmol/L (ref 135–145)
Total Bilirubin: 0.8 mg/dL (ref 0.3–1.2)
Total Protein: 6.3 g/dL — ABNORMAL LOW (ref 6.5–8.1)

## 2020-10-14 LAB — CBC
HCT: 40.1 % (ref 39.0–52.0)
Hemoglobin: 14.2 g/dL (ref 13.0–17.0)
MCH: 30.9 pg (ref 26.0–34.0)
MCHC: 35.4 g/dL (ref 30.0–36.0)
MCV: 87.2 fL (ref 80.0–100.0)
Platelets: 175 10*3/uL (ref 150–400)
RBC: 4.6 MIL/uL (ref 4.22–5.81)
RDW: 12.2 % (ref 11.5–15.5)
WBC: 5 10*3/uL (ref 4.0–10.5)
nRBC: 0 % (ref 0.0–0.2)

## 2020-10-14 SURGERY — REPAIR, HERNIA, UMBILICAL, ADULT
Anesthesia: General | Site: Abdomen

## 2020-10-14 MED ORDER — ORAL CARE MOUTH RINSE: 15.0000 mL | Freq: Once | OROMUCOSAL | Status: AC

## 2020-10-14 MED ORDER — BUPIVACAINE-EPINEPHRINE 0.25% -1:200000 IJ SOLN
INTRAMUSCULAR | Status: DC | PRN
  Administered 2020-10-14: 20 mL

## 2020-10-14 MED ORDER — FENTANYL CITRATE (PF) 250 MCG/5ML IJ SOLN
INTRAMUSCULAR | Status: DC | PRN
  Administered 2020-10-14 (×2): 50 ug via INTRAVENOUS

## 2020-10-14 MED ORDER — ACETAMINOPHEN 500 MG PO TABS
1000.0000 mg | ORAL_TABLET | Freq: Once | ORAL | Status: AC
  Administered 2020-10-14: 1000 mg via ORAL
  Filled 2020-10-14: qty 2

## 2020-10-14 MED ORDER — CHLORHEXIDINE GLUCONATE 0.12 % MT SOLN
15.0000 mL | Freq: Once | OROMUCOSAL | Status: AC
  Filled 2020-10-14: qty 15

## 2020-10-14 MED ORDER — FENTANYL CITRATE (PF) 250 MCG/5ML IJ SOLN
INTRAMUSCULAR | Status: AC
  Filled 2020-10-14: qty 5

## 2020-10-14 MED ORDER — AMISULPRIDE (ANTIEMETIC) 5 MG/2ML IV SOLN: 10.0000 mg | Freq: Once | INTRAVENOUS | Status: DC | PRN

## 2020-10-14 MED ORDER — SUGAMMADEX SODIUM 200 MG/2ML IV SOLN
INTRAVENOUS | Status: DC | PRN
  Administered 2020-10-14: 200 mg via INTRAVENOUS

## 2020-10-14 MED ORDER — CEFAZOLIN SODIUM-DEXTROSE 2-4 GM/100ML-% IV SOLN
INTRAVENOUS | Status: AC
  Filled 2020-10-14: qty 100

## 2020-10-14 MED ORDER — CEFAZOLIN SODIUM-DEXTROSE 2-4 GM/100ML-% IV SOLN
2.0000 g | INTRAVENOUS | Status: AC
  Administered 2020-10-14: 2 g via INTRAVENOUS
  Filled 2020-10-14: qty 100

## 2020-10-14 MED ORDER — ONDANSETRON HCL 4 MG/2ML IJ SOLN
INTRAMUSCULAR | Status: DC | PRN
  Administered 2020-10-14: 4 mg via INTRAVENOUS

## 2020-10-14 MED ORDER — MIDAZOLAM HCL 2 MG/2ML IJ SOLN
INTRAMUSCULAR | Status: AC
  Filled 2020-10-14: qty 2

## 2020-10-14 MED ORDER — BUPIVACAINE-EPINEPHRINE (PF) 0.25% -1:200000 IJ SOLN
INTRAMUSCULAR | Status: AC
  Filled 2020-10-14: qty 30

## 2020-10-14 MED ORDER — CHLORHEXIDINE GLUCONATE 0.12 % MT SOLN
OROMUCOSAL | Status: AC
  Administered 2020-10-14: 15 mL via OROMUCOSAL
  Filled 2020-10-14: qty 15

## 2020-10-14 MED ORDER — LACTATED RINGERS IV SOLN: INTRAVENOUS | Status: DC

## 2020-10-14 MED ORDER — LIDOCAINE 2% (20 MG/ML) 5 ML SYRINGE
INTRAMUSCULAR | Status: DC | PRN
  Administered 2020-10-14: 60 mg via INTRAVENOUS

## 2020-10-14 MED ORDER — DEXAMETHASONE SODIUM PHOSPHATE 10 MG/ML IJ SOLN
INTRAMUSCULAR | Status: DC | PRN
  Administered 2020-10-14: 4 mg via INTRAVENOUS

## 2020-10-14 MED ORDER — ROCURONIUM BROMIDE 10 MG/ML (PF) SYRINGE
PREFILLED_SYRINGE | INTRAVENOUS | Status: DC | PRN
  Administered 2020-10-14: 60 mg via INTRAVENOUS

## 2020-10-14 MED ORDER — FENTANYL CITRATE (PF) 100 MCG/2ML IJ SOLN
25.0000 ug | INTRAMUSCULAR | Status: DC | PRN
Start: 2020-10-14 — End: 2020-10-14

## 2020-10-14 MED ORDER — OXYCODONE HCL 5 MG PO TABS: 5.0000 mg | ORAL_TABLET | Freq: Four times a day (QID) | ORAL | 0 refills | Status: DC | PRN

## 2020-10-14 MED ORDER — PROPOFOL 10 MG/ML IV BOLUS
INTRAVENOUS | Status: DC | PRN
  Administered 2020-10-14: 200 mg via INTRAVENOUS

## 2020-10-14 MED ORDER — PROPOFOL 10 MG/ML IV BOLUS
INTRAVENOUS | Status: AC
  Filled 2020-10-14: qty 20

## 2020-10-14 MED ORDER — EPHEDRINE SULFATE 50 MG/ML IJ SOLN
INTRAMUSCULAR | Status: DC | PRN
  Administered 2020-10-14 (×2): 5 mg via INTRAVENOUS

## 2020-10-14 MED ORDER — MIDAZOLAM HCL 5 MG/5ML IJ SOLN
INTRAMUSCULAR | Status: DC | PRN
  Administered 2020-10-14: 2 mg via INTRAVENOUS

## 2020-10-14 MED ORDER — GLYCOPYRROLATE 0.2 MG/ML IJ SOLN
INTRAMUSCULAR | Status: DC | PRN
  Administered 2020-10-14: .2 mg via INTRAVENOUS

## 2020-10-14 SURGICAL SUPPLY — 34 items
BLADE CLIPPER SURG (BLADE) ×2 IMPLANT
CANISTER SUCT 3000ML PPV (MISCELLANEOUS) ×2 IMPLANT
CHLORAPREP W/TINT 26 (MISCELLANEOUS) ×2 IMPLANT
COVER SURGICAL LIGHT HANDLE (MISCELLANEOUS) ×4 IMPLANT
COVER WAND RF STERILE (DRAPES) ×2 IMPLANT
DERMABOND ADVANCED (GAUZE/BANDAGES/DRESSINGS) ×1
DERMABOND ADVANCED .7 DNX12 (GAUZE/BANDAGES/DRESSINGS) ×1 IMPLANT
DRAPE LAPAROTOMY 100X72 PEDS (DRAPES) ×2 IMPLANT
ELECT CAUTERY BLADE 6.4 (BLADE) IMPLANT
ELECT REM PT RETURN 9FT ADLT (ELECTROSURGICAL) ×2
ELECTRODE REM PT RTRN 9FT ADLT (ELECTROSURGICAL) ×1 IMPLANT
GLOVE BIOGEL PI IND STRL 6 (GLOVE) ×1 IMPLANT
GLOVE BIOGEL PI INDICATOR 6 (GLOVE) ×1
GLOVE BIOGEL PI MICRO 5.5 (GLOVE) ×1
GLOVE BIOGEL PI MICRO STRL 5.5 (GLOVE) ×1 IMPLANT
GLOVE SURG ENC MOIS LTX SZ7 (GLOVE) ×2 IMPLANT
GOWN STRL REUS W/ TWL LRG LVL3 (GOWN DISPOSABLE) ×3 IMPLANT
GOWN STRL REUS W/TWL LRG LVL3 (GOWN DISPOSABLE) ×6
KIT BASIN OR (CUSTOM PROCEDURE TRAY) ×2 IMPLANT
KIT TURNOVER KIT B (KITS) ×2 IMPLANT
MESH VENTRALEX ST 2.5 CRC MED (Mesh General) ×2 IMPLANT
NEEDLE HYPO 25GX1X1/2 BEV (NEEDLE) ×2 IMPLANT
NS IRRIG 1000ML POUR BTL (IV SOLUTION) ×2 IMPLANT
PACK GENERAL/GYN (CUSTOM PROCEDURE TRAY) ×2 IMPLANT
PAD ARMBOARD 7.5X6 YLW CONV (MISCELLANEOUS) ×2 IMPLANT
PENCIL SMOKE EVACUATOR (MISCELLANEOUS) ×2 IMPLANT
SUT MNCRL AB 4-0 PS2 18 (SUTURE) ×2 IMPLANT
SUT NOVA NAB DX-16 0-1 5-0 T12 (SUTURE) ×8 IMPLANT
SUT NOVA NAB GS-21 0 18 T12 DT (SUTURE) ×2 IMPLANT
SUT VIC AB 3-0 SH 27 (SUTURE) ×2
SUT VIC AB 3-0 SH 27X BRD (SUTURE) ×1 IMPLANT
SYR CONTROL 10ML LL (SYRINGE) ×2 IMPLANT
TOWEL GREEN STERILE (TOWEL DISPOSABLE) ×2 IMPLANT
TOWEL GREEN STERILE FF (TOWEL DISPOSABLE) ×2 IMPLANT

## 2020-10-14 NOTE — Anesthesia Procedure Notes (Signed)
Procedure Name: Intubation Date/Time: 10/14/2020 8:37 AM Performed by: Amadeo Garnet, CRNA Pre-anesthesia Checklist: Patient identified, Emergency Drugs available, Suction available and Patient being monitored Patient Re-evaluated:Patient Re-evaluated prior to induction Oxygen Delivery Method: Circle system utilized Preoxygenation: Pre-oxygenation with 100% oxygen Induction Type: IV induction Ventilation: Mask ventilation without difficulty and Oral airway inserted - appropriate to patient size Laryngoscope Size: Mac and 4 Grade View: Grade I Tube type: Oral Tube size: 7.5 mm Number of attempts: 1 Airway Equipment and Method: Stylet and Oral airway Placement Confirmation: ETT inserted through vocal cords under direct vision,  positive ETCO2 and breath sounds checked- equal and bilateral Secured at: 22 cm Tube secured with: Tape Dental Injury: Teeth and Oropharynx as per pre-operative assessment

## 2020-10-14 NOTE — H&P (Signed)
Matthew Richardson is an 60 y.o. male.   Chief Complaint: umbilical hernia HPI: Matthew Richardson is a 60 yo male who presents with an umbilical hernia. He has had it for 5-6 years and it often causes discomfort after eating. No symptoms of incarceration or obstruction. He presents today for surgery. COVID negative on 2/19.  Past Medical History:  Diagnosis Date  . Anemia   . Asthma   . 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 Eczema   . GERD (gastroesophageal reflux disease)   . Hepatitis    Hep B - treated  . History of kidney stones    passed stones  . History of nephrolithiasis   . Hypertension    "white coat syndrome"--denies hypertension  . PONV (postoperative nausea and vomiting)   . Sleep apnea    does not use cpap    Past Surgical History:  Procedure Laterality Date  . TENDON REPAIR Left 01/14/2016   Procedure: LEFT HAND WOUND EXPLORATION WITH TENDON REPAIR;  Surgeon: Iran Planas, MD;  Location: [facility];  Service: Orthopedics;  Laterality: Left;  . WISDOM TOOTH EXTRACTION  1994    Family History  Problem Relation Age of Onset  . Diabetes Mother   . Colon cancer Paternal Grandfather        died of colon cancer at age 85  . Cancer Neg Hx   . Alcohol abuse Neg Hx   . Drug abuse Neg Hx   . Early death Neg Hx   . Hearing loss Neg Hx   . Heart disease Neg Hx   . Hyperlipidemia Neg Hx   . Hypertension Neg Hx   . Kidney disease Neg Hx   . Stroke Neg Hx    Social History:  reports that he quit smoking about 19 years ago. His smoking use included cigarettes. He has a 20.00 pack-year smoking history. He has never used smokeless tobacco. He reports current alcohol use. He reports that he does not use drugs.  Allergies:  Allergies  Allergen Reactions  . Singulair [Montelukast Sodium] Nausea Only  . Sulfamethoxazole-Trimethoprim Nausea And Vomiting  . Sulfonamide Derivatives Nausea And Vomiting  . Neosporin [Neomycin-Bacitracin Zn-Polymyx] Rash and Other (See Comments)    LOOKS LIKE  IT "BURNS" OFF THE TOP LAYER OF SKIN  . Penicillins Rash    Has patient had a PCN reaction causing immediate rash, facial/tongue/throat swelling, SOB or lightheadedness with hypotension: Yes Has patient had a PCN reaction causing severe rash involving mucus membranes or skin necrosis: No Has patient had a PCN reaction that required hospitalization No Has patient had a PCN reaction occurring within the last 10 years: No If all of the above answers are "NO", then may proceed with Cephalosporin use.   Newell Coral [Bacitracin-Polymyxin B] Rash and Other (See Comments)    LOOKS LIKE IT "BURNS" OFF THE TOP LAYER OF SKIN    Medications Prior to Admission  Medication Sig Dispense Refill  . Cholecalciferol 50 MCG (2000 UT) TABS Take 1 tablet (2,000 Units total) by mouth daily. 90 tablet 1  . doxycycline (VIBRA-TABS) 100 MG tablet Take 1 tablet (100 mg total) by mouth 2 (two) times daily. 14 tablet 0  . irbesartan (AVAPRO) 150 MG tablet Take 1 tablet by mouth once daily 90 tablet 0  . omeprazole (PRILOSEC) 40 MG capsule Take 1 capsule by mouth once daily 90 capsule 0  . sildenafil (REVATIO) 20 MG tablet TAKE FOUR TABLETS BY MOUTH DAILY AS NEEDED  60 tablet 5  . albuterol (PROVENTIL HFA;VENTOLIN HFA) 108 (90 Base) MCG/ACT inhaler INHALE 2 PUFFS BY MOUTH EVERY 6 HOURS AS NEEDED FOR WHEEZING OR SHORTNESS OF BREATH 18 each 6  . TRELEGY ELLIPTA 100-62.5-25 MCG/INH AEPB INHALE 1 PUFF INTO THE LUNGS DAILY 60 each 3    No results found for this or any previous visit (from the past 34 hour(s)). No results found.  Review of Systems  Constitutional: Negative for chills and fever.  Respiratory: Negative for shortness of breath.   Gastrointestinal: Negative for abdominal pain, nausea and vomiting.  Allergic/Immunologic: Negative for immunocompromised state.  Neurological: Negative for facial asymmetry and speech difficulty.  Psychiatric/Behavioral: Negative for agitation and confusion.    Blood pressure  (!) 142/84, pulse (!) 50, temperature 98 F (36.7 C), temperature source Oral, height 5\' 11"  (1.803 m), weight 90.7 kg, SpO2 97 %. Physical Exam Constitutional:      Appearance: Normal appearance.  HENT:     Head: Normocephalic and atraumatic.  Eyes:     General: No scleral icterus.    Conjunctiva/sclera: Conjunctivae normal.  Pulmonary:     Effort: Pulmonary effort is normal. No respiratory distress.  Abdominal:     General: There is no distension.     Palpations: Abdomen is soft.     Tenderness: There is no abdominal tenderness.     Comments: Umbilical hernia, soft and reducible, nontender to palpation.  Musculoskeletal:        General: Normal range of motion.     Cervical back: Normal range of motion.  Skin:    General: Skin is warm and dry.     Coloration: Skin is not jaundiced.  Neurological:     General: No focal deficit present.     Mental Status: He is alert and oriented to person, place, and time.  Psychiatric:        Mood and Affect: Mood normal.        Behavior: Behavior normal.        Thought Content: Thought content normal.      Assessment/Plan 60 yo male with reducible umbilical hernia. Proceed to OR for open repair with likely mesh placement. Informed consent obtained, all questions answered. Plan for discharge home from PACU.  Dwan Bolt, MD 10/14/2020, 7:30 AM

## 2020-10-14 NOTE — Anesthesia Postprocedure Evaluation (Signed)
Anesthesia Post Note  Patient: Matthew Richardson  Procedure(s) Performed: OPEN UMBILICAL HERNIA REPAIR WITH MESH (N/A Abdomen)     Patient location during evaluation: PACU Anesthesia Type: General Level of consciousness: awake and alert, oriented and patient cooperative Pain management: pain level controlled Vital Signs Assessment: post-procedure vital signs reviewed and stable Respiratory status: spontaneous breathing, nonlabored ventilation and respiratory function stable Cardiovascular status: blood pressure returned to baseline and stable Postop Assessment: no apparent nausea or vomiting Anesthetic complications: no   No complications documented.  Last Vitals:  Vitals:   10/14/20 0955 10/14/20 1010  BP: 125/80 117/72  Pulse: (!) 53 (!) 55  Resp: 13 12  Temp: (!) 36.3 C   SpO2: 98% 93%    Last Pain:  Vitals:   10/14/20 1010  TempSrc:   PainSc: 0-No pain                 Pervis Hocking

## 2020-10-14 NOTE — Discharge Instructions (Signed)
General Anesthesia, Adult, Care After This sheet gives you information about how to care for yourself after your procedure. Your health care provider may also give you more specific instructions. If you have problems or questions, contact your health care provider. What can I expect after the procedure? After the procedure, the following side effects are common:  Pain or discomfort at the IV site.  Nausea.  Vomiting.  Sore throat.  Trouble concentrating.  Feeling cold or chills.  Feeling weak or tired.  Sleepiness and fatigue.  Soreness and body aches. These side effects can affect parts of the body that were not involved in surgery. Follow these instructions at home: For the time period you were told by your health care provider:  Rest.  Do not participate in activities where you could fall or become injured.  Do not drive or use machinery.  Do not drink alcohol.  Do not take sleeping pills or medicines that cause drowsiness.  Do not make important decisions or sign legal documents.  Do not take care of children on your own.   Eating and drinking  Follow any instructions from your health care provider about eating or drinking restrictions.  When you feel hungry, start by eating small amounts of foods that are soft and easy to digest (bland), such as toast. Gradually return to your regular diet.  Drink enough fluid to keep your urine pale yellow.  If you vomit, rehydrate by drinking water, juice, or clear broth. General instructions  If you have sleep apnea, surgery and certain medicines can increase your risk for breathing problems. Follow instructions from your health care provider about wearing your sleep device: ? Anytime you are sleeping, including during daytime naps. ? While taking prescription pain medicines, sleeping medicines, or medicines that make you drowsy.  Have a responsible adult stay with you for the time you are told. It is important to have  someone help care for you until you are awake and alert.  Return to your normal activities as told by your health care provider. Ask your health care provider what activities are safe for you.  Take over-the-counter and prescription medicines only as told by your health care provider.  If you smoke, do not smoke without supervision.  Keep all follow-up visits as told by your health care provider. This is important. Contact a health care provider if:  You have nausea or vomiting that does not get better with medicine.  You cannot eat or drink without vomiting.  You have pain that does not get better with medicine.  You are unable to pass urine.  You develop a skin rash.  You have a fever.  You have redness around your IV site that gets worse. Get help right away if:  You have difficulty breathing.  You have chest pain.  You have blood in your urine or stool, or you vomit blood. Summary  After the procedure, it is common to have a sore throat or nausea. It is also common to feel tired.  Have a responsible adult stay with you for the time you are told. It is important to have someone help care for you until you are awake and alert.  When you feel hungry, start by eating small amounts of foods that are soft and easy to digest (bland), such as toast. Gradually return to your regular diet.  Drink enough fluid to keep your urine pale yellow.  Return to your normal activities as told by your health care provider.   Ask your health care provider what activities are safe for you. This information is not intended to replace advice given to you by your health care provider. Make sure you discuss any questions you have with your health care provider. Document Revised: 04/23/2020 Document Reviewed: 11/21/2019 Elsevier Patient Education  2021 [employer] INSTRUCTIONS  Activity . No heavy lifting greater than 10 pounds for 6 weeks after  surgery. Madaline Brilliant to shower, but do not bathe or submerge incision underwater. . Do not drive while taking narcotic pain medication.  Wound Care . Your incision is covered with skin glue called Dermabond. This will peel off on its own over time. . You may shower and allow warm soapy water to run over your incisions in 48 hours after surgery. Gently pat dry. . Do not bathe, swim or submerge your incision underwater. . Monitor your incision for any new redness, tenderness, or drainage.  When to Call us: [name] Fever greater than 100.5 . New redness, drainage, or swelling at incision site . Severe pain, nausea, or vomiting  Follow-up You have an appointment scheduled with Dr. Zenia Resides on 10/30/20 at 8:30am. This will be at the [facility] office at [address], [place], [place], [place]. Please arrive at least 15 minutes prior to your scheduled appointment time.  For questions or concerns, please call the office at [phone].

## 2020-10-14 NOTE — Op Note (Signed)
Date: 10/14/20  Patient: Matthew Richardson MRN: [id number]  Preoperative Diagnosis: Umbilical hernia Postoperative Diagnosis: Same  Procedure: Open umbilical hernia repair with mesh underlay  Surgeon: Michaelle Birks, MD Assistant: Eben Burow, MD (Resident)  EBL: Minimal  Anesthesia: General  Specimens: None  Indications: [name] is a 60 yo male who presented with a reducible umbilical hernia that has been present for about 5 years. He was having symptoms and elected to proceed with surgical repair.  Findings: Umbilical hernia containing preperitoneal fat with a 3cm fascial defect. Repaired with a 6.4cm Ventralex mesh underlay.  Procedure details: Informed consent was obtained in the preoperative area prior to the procedure. The patient was brought to the operating room and placed on the table in the supine position. General anesthesia was induced and appropriate lines and drains were placed for intraoperative monitoring. Perioperative antibiotics were administered per SCIP guidelines. The abdomen was prepped and draped in the usual sterile fashion. A pre-procedure timeout was taken verifying patient identity, surgical site and procedure to be performed.  A curvilinear infraumbilical skin incision was made and the subcutaneous tissue was divided with cautery to expose the fascia. The umbilical stalk was circumferentially dissected out with a tonsil clamp. The umbilical skin was then taken off the stalk and underlying hernia sac using a scalpel. The hernia sac was then circumferentially dissected off the fascia. The sac was opened and excised. The hernia contained viable preperitoneal fat but no bowel. The hernia contents were reduced back into the abdomen. The fascia surrounding the defect was cleared of subcutaneous fat using cautery. The fascia defect was measured at 3cm in diameter. Once the fascial edges were all free, a 6.4cm circular Ventralex mesh was brought onto the field.  The mesh was tacked to the fascia using transfascial 1 Novafil sutures at 4 points. The mesh was then inserted through the fascial defect and pulled flush with the abdominal wall, posterior to the fascia. The tacking sutures were tied down, and the mesh appeared to lay flat against the abdominal wall. The fascia was then closed over the mesh using simple interrupted 1 Novafil sutures. The tabs on the mesh were removed. The wound was irrigated and appeared hemostatic. The umbilical skin was tacked down to the fascia using a 3-0 Vicryl suture. The subcutaneous tissue was approximated with interrupted 3-0 Vicryl, and the skin was closed with running subcuticular 4-0 monocryl suture. Dermabond was applied.  The patient tolerated the procedure well with no apparent complications. All counts were correct x2 at the end of the procedure. The patient was extubated and taken to PACU in stable condition.  Michaelle Birks, MD 10/14/20 9:53 AM

## 2020-10-14 NOTE — Transfer of Care (Signed)
Immediate Anesthesia Transfer of Care Note  Patient: Matthew Richardson  Procedure(s) Performed: OPEN UMBILICAL HERNIA REPAIR WITH MESH (N/A Abdomen)  Patient Location: PACU  Anesthesia Type:General  Level of Consciousness: awake, alert  and oriented  Airway & Oxygen Therapy: Patient Spontanous Breathing and Patient connected to face mask oxygen  Post-op Assessment: Report given to RN, Post -op Vital signs reviewed and stable and Patient moving all extremities  Post vital signs: Reviewed and stable  Last Vitals:  Vitals Value Taken Time  BP 125/80 10/14/20 0955  Temp 36.3 C 10/14/20 0955  Pulse 58 10/14/20 0956  Resp 14 10/14/20 0956  SpO2 100 % 10/14/20 0956  Vitals shown include unvalidated device data.  Last Pain:  Vitals:   10/14/20 0711  TempSrc:   PainSc: 0-No pain         Complications: No complications documented.

## 2020-10-15 ENCOUNTER — Encounter (HOSPITAL_COMMUNITY): Payer: Self-pay | Admitting: Surgery

## 2020-12-28 ENCOUNTER — Telehealth: Payer: Self-pay | Admitting: Internal Medicine

## 2020-12-28 DIAGNOSIS — I5189 Other ill-defined heart diseases: Secondary | ICD-10-CM

## 2020-12-28 DIAGNOSIS — K219 Gastro-esophageal reflux disease without esophagitis: Secondary | ICD-10-CM

## 2020-12-28 DIAGNOSIS — I10 Essential (primary) hypertension: Secondary | ICD-10-CM

## 2020-12-29 ENCOUNTER — Emergency Department
Admission: EM | Admit: 2020-12-29 | Discharge: 2020-12-29 | Disposition: A | Discharge status: Home / Self Care | Payer: No Typology Code available for payment source | Attending: Emergency Medicine | Admitting: Emergency Medicine

## 2020-12-29 ENCOUNTER — Other Ambulatory Visit: Payer: Self-pay

## 2020-12-29 DIAGNOSIS — Z85828 Personal history of other malignant neoplasm of skin: Secondary | ICD-10-CM | POA: Diagnosis not present

## 2020-12-29 DIAGNOSIS — J45909 Unspecified asthma, uncomplicated: Secondary | ICD-10-CM | POA: Diagnosis not present

## 2020-12-29 DIAGNOSIS — S61412A Laceration without foreign body of left hand, initial encounter: Secondary | ICD-10-CM | POA: Insufficient documentation

## 2020-12-29 DIAGNOSIS — Z87891 Personal history of nicotine dependence: Secondary | ICD-10-CM | POA: Insufficient documentation

## 2020-12-29 DIAGNOSIS — S6992XA Unspecified injury of left wrist, hand and finger(s), initial encounter: Secondary | ICD-10-CM | POA: Diagnosis present

## 2020-12-29 DIAGNOSIS — Z23 Encounter for immunization: Secondary | ICD-10-CM | POA: Diagnosis not present

## 2020-12-29 DIAGNOSIS — I10 Essential (primary) hypertension: Secondary | ICD-10-CM | POA: Diagnosis not present

## 2020-12-29 DIAGNOSIS — W208XXA Other cause of strike by thrown, projected or falling object, initial encounter: Secondary | ICD-10-CM | POA: Diagnosis not present

## 2020-12-29 MED ORDER — CEPHALEXIN 500 MG PO CAPS: 1000.0000 mg | ORAL_CAPSULE | Freq: Two times a day (BID) | ORAL | 0 refills | Status: AC

## 2020-12-29 MED ORDER — LIDOCAINE-EPINEPHRINE (PF) 2 %-1:200000 IJ SOLN
10.0000 mL | Freq: Once | INTRAMUSCULAR | Status: AC
  Administered 2020-12-29: 10 mL via INTRADERMAL
  Filled 2020-12-29: qty 20

## 2020-12-29 MED ORDER — DOXYCYCLINE MONOHYDRATE 100 MG PO TABS: 100.0000 mg | ORAL_TABLET | Freq: Two times a day (BID) | ORAL | 0 refills | Status: DC

## 2020-12-29 MED ORDER — CEPHALEXIN 500 MG PO CAPS
1000.0000 mg | ORAL_CAPSULE | Freq: Once | ORAL | Status: AC
  Administered 2020-12-29: 1000 mg via ORAL
  Filled 2020-12-29: qty 2

## 2020-12-29 MED ORDER — TETANUS-DIPHTH-ACELL PERTUSSIS 5-2.5-18.5 LF-MCG/0.5 IM SUSY
0.5000 mL | PREFILLED_SYRINGE | Freq: Once | INTRAMUSCULAR | Status: AC
  Administered 2020-12-29: 0.5 mL via INTRAMUSCULAR
  Filled 2020-12-29: qty 0.5

## 2020-12-29 MED ORDER — DOXYCYCLINE HYCLATE 100 MG PO TABS
100.0000 mg | ORAL_TABLET | Freq: Once | ORAL | Status: DC
  Filled 2020-12-29: qty 1

## 2020-12-29 NOTE — ED Triage Notes (Signed)
Laceration anterior left hand with sheet metal. Bleeding controlled with pressure applied.    Full ROM of all fingers.

## 2020-12-29 NOTE — Discharge Instructions (Addendum)
Please take antibiotic as prescribed. Please have wound checked and sutures removed in 5-7 days.

## 2020-12-29 NOTE — ED Notes (Signed)
See triage note  Presents with laceration to back of left hand  States he was working on garage door  And a piece of sheet metal fell down onto hand

## 2020-12-29 NOTE — Telephone Encounter (Signed)
Denied.   Pt overdue for OV.  Per last AVS.  Follow-up: Return in about 3 months (around 08/18/2020).   Pt has been informed.

## 2020-12-29 NOTE — Telephone Encounter (Signed)
Patient calling, asking if we can do a short supply until his appointment on 06.08.22

## 2020-12-30 NOTE — ED Provider Notes (Signed)
[facility] Emergency Department Provider Note  ____________________________________________   Event Date/Time   First MD Initiated Contact with Patient 12/29/20 1627     (approximate)  I have reviewed the triage vital signs and the nursing notes.   HISTORY  Chief Complaint Laceration   HPI Matthew Richardson is a 60 y.o. male who presents to the emergency department today for evaluation of left wrist laceration.  Patient states that he was attempting to repair a garage door when a piece of sheet metal fell on the dorsum of his wrist.  He states that he had difficulty controlling the bleeding initially, but reports that it has since slowed down.  He reports he can move all of his digits as well as his wrist without difficulty.  He is unsure of his last tetanus.         Past Medical History:  Diagnosis Date  . Anemia   . Asthma   . 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 Eczema   . GERD (gastroesophageal reflux disease)   . Hepatitis    Hep B - treated  . History of kidney stones    passed stones  . History of nephrolithiasis   . Hypertension    "white coat syndrome"--denies hypertension  . PONV (postoperative nausea and vomiting)   . Sleep apnea    does not use cpap    Patient Active Problem List   Diagnosis Date Noted  . Grade I diastolic dysfunction 36/14/4315  . History of hepatitis B virus infection 05/29/2019  . Hyperglycemia 05/29/2019  . Abnormal electrocardiogram (ECG) (EKG) 05/29/2019  . Vitamin D deficiency 05/29/2019  . Essential hypertension 11/07/2017  . Atherosclerosis of aorta ([place]) 11/07/2017  . GERD without esophagitis 09/20/2017  . Erectile dysfunction due to arterial insufficiency 09/20/2017  . Kidney stone 07/12/2013  . Routine general medical examination at a health care facility 03/13/2013  . Umbilical hernia 40/03/6760  . Obstructive sleep apnea 12/25/2011  . Asthma, mild intermittent 04/29/2011    Past Surgical  History:  Procedure Laterality Date  . TENDON REPAIR Left 01/14/2016   Procedure: LEFT HAND WOUND EXPLORATION WITH TENDON REPAIR;  Surgeon: Iran Planas, MD;  Location: [place];  Service: Orthopedics;  Laterality: Left;  . UMBILICAL HERNIA REPAIR N/A 10/14/2020   Procedure: OPEN UMBILICAL HERNIA REPAIR WITH MESH;  Surgeon: Dwan Bolt, MD;  Location: [place];  Service: General;  Laterality: N/A;  . [place]    Prior to Admission medications   Medication Sig Start Date End Date Taking? Authorizing Provider  cephALEXin (KEFLEX) 500 MG capsule Take 2 capsules (1,000 mg total) by mouth 2 (two) times daily for 10 days. 12/29/20 01/08/21 Yes Nour Rodrigues, Farrel Gordon, PA  albuterol (PROVENTIL HFA;VENTOLIN HFA) 108 (90 Base) MCG/ACT inhaler INHALE 2 PUFFS BY MOUTH EVERY 6 HOURS AS NEEDED FOR WHEEZING OR SHORTNESS OF BREATH Patient taking differently: Inhale 2 puffs into the lungs every 6 (six) hours as needed for wheezing or shortness of breath. 04/16/18   Baird Lyons D, MD  Cholecalciferol 50 MCG (2000 UT) TABS Take 1 tablet (2,000 Units total) by mouth daily. 05/29/19   Janith Lima, MD  ELDERBERRY PO Take 1 tablet by mouth daily.    [provider]  irbesartan (AVAPRO) 150 MG tablet Take 1 tablet by mouth once daily Patient taking differently: Take 150 mg by mouth daily. 09/28/20   Janith Lima, MD  omeprazole ([place]) 40  MG capsule Take 1 capsule by mouth once daily Patient taking differently: Take 40 mg by mouth daily. 09/28/20   Janith Lima, MD  oxyCODONE (OXY IR/ROXICODONE) 5 MG immediate release tablet Take 1 tablet (5 mg total) by mouth every 6 (six) hours as needed for severe pain. 10/14/20   Dwan Bolt, MD  sildenafil (REVATIO) 20 MG tablet TAKE FOUR TABLETS BY MOUTH DAILY AS NEEDED Patient taking differently: Take 80 mg by mouth daily as needed (ED). 05/20/20   Janith Lima, MD  TRELEGY ELLIPTA 100-62.5-25 MCG/INH AEPB INHALE 1 PUFF INTO THE LUNGS  DAILY Patient taking differently: Inhale 1 puff into the lungs daily as needed (wheezing/allergy flare up to ragweed/mold). 02/18/20   Baird Lyons D, MD  zinc gluconate 50 MG tablet Take 50 mg by mouth daily.    [provider]    Allergies Singulair [montelukast sodium], Sulfamethoxazole-trimethoprim, Sulfonamide derivatives, Neosporin [neomycin-bacitracin zn-polymyx], Penicillins, and Polysporin [bacitracin-polymyxin b]  Family History  Problem Relation Age of Onset  . Diabetes Mother   . Colon cancer Paternal Grandfather        died of colon cancer at age 73  . Cancer Neg Hx   . Alcohol abuse Neg Hx   . Drug abuse Neg Hx   . Early death Neg Hx   . Hearing loss Neg Hx   . Heart disease Neg Hx   . Hyperlipidemia Neg Hx   . Hypertension Neg Hx   . Kidney disease Neg Hx   . Stroke Neg Hx     Social History Social History   Tobacco Use  . Smoking status: Former Smoker    Packs/day: 1.00    Years: 20.00    Pack years: 20.00    Types: Cigarettes    Quit date: 08/22/2001    Years since quitting: 19.3  . Smokeless tobacco: Never Used  Vaping Use  . Vaping Use: Never used  Substance Use Topics  . Alcohol use: Yes    Comment: socially-seldom  . Drug use: No    Review of Systems Constitutional: No fever/chills Eyes: No visual changes. ENT: No sore throat. Cardiovascular: Denies chest pain. Respiratory: Denies shortness of breath. Gastrointestinal: No abdominal pain.  No nausea, no vomiting.  No diarrhea.  No constipation. Genitourinary: Negative for dysuria. Musculoskeletal: + Left wrist laceration, negative for back pain. Skin: Negative for rash. Neurological: Negative for headaches, focal weakness or numbness.   ____________________________________________   PHYSICAL EXAM:  VITAL SIGNS: ED Triage Vitals  Enc Vitals Group     BP 12/29/20 1603 (!) 155/103     Pulse Rate 12/29/20 1603 89     Resp 12/29/20 1603 20     Temp 12/29/20 1603 98.3 F (36.8  C)     Temp Source 12/29/20 1603 Oral     SpO2 12/29/20 1603 97 %     Weight 12/29/20 1603 200 lb (90.7 kg)     Height 12/29/20 1603 5\' 11"  (1.803 m)     Head Circumference --      Peak Flow --      Pain Score 12/29/20 1607 5     Pain Loc --      Pain Edu? --      Excl. in [place]? --    Constitutional: Alert and oriented. Well appearing and in no acute distress. Eyes: Conjunctivae are normal. PERRL. EOMI. Head: Atraumatic. Nose: No congestion/rhinnorhea. Mouth/Throat: Mucous membranes are moist.  Oropharynx non-erythematous. Neck: No stridor.   Musculoskeletal: There  is roughly a 5 cm laceration to the dorsum of the left wrist, just at the level of the distal radius.  It is approximately 2 to 4 mm deep, there is exposed tendon, though no clear tendon injury.  Patient able to actively flex and extend the wrist as well as all digits without difficulty.  Radial pulse 2+, capillary refill less than 3 seconds. Neurologic:  Normal speech and language. No gross focal neurologic deficits are appreciated. No gait instability. Skin:  Skin is warm, dry and intact except as described above. No rash noted. Psychiatric: Mood and affect are normal. Speech and behavior are normal.  ____________________________________________   PROCEDURES  Procedure(s) performed (including Critical Care):  Marland KitchenMarland KitchenLaceration Repair  Date/Time: 12/30/2020 12:13 AM Performed by: Marlana Salvage, PA Authorized by: Marlana Salvage, PA   Consent:    Consent obtained:  Verbal   Consent given by:  Patient   Risks, benefits, and alternatives were discussed: yes     Risks discussed:  Infection, need for additional repair, poor cosmetic result, pain and retained foreign body   Alternatives discussed:  No treatment Universal protocol:    Procedure explained and questions answered to patient or proxy's satisfaction: yes     Patient identity confirmed:  Verbally with patient Anesthesia:    Anesthesia method:  Local  infiltration   Local anesthetic:  Lidocaine 1% WITH epi Laceration details:    Location:  Shoulder/arm   Shoulder/arm location:  L lower arm   Length (cm):  5   Depth (mm):  3 Pre-procedure details:    Preparation:  Patient was prepped and draped in usual sterile fashion Exploration:    Hemostasis achieved with:  Direct pressure and epinephrine   Wound exploration: wound explored through full range of motion and entire depth of wound visualized   Treatment:    Area cleansed with:  Povidone-iodine   Amount of cleaning:  Extensive   Irrigation solution:  Sterile saline   Irrigation method:  Tap and syringe Skin repair:    Repair method:  Sutures   Suture size:  5-0   Suture material:  Nylon   Suture technique:  Simple interrupted   Number of sutures:  7 Approximation:    Approximation:  Close Repair type:    Repair type:  Simple Post-procedure details:    Dressing:  Non-adherent dressing   Procedure completion:  Tolerated well, no immediate complications     ____________________________________________   INITIAL IMPRESSION / ASSESSMENT AND PLAN / ED COURSE  As part of my medical decision making, I reviewed the following data within the electronic MEDICAL RECORD NUMBER Nursing notes reviewed and incorporated and Notes from prior ED visits        Patient is a 60 year old male who presents to the emergency department for evaluation of laceration to the left wrist.  See HPI for further details.  In triage, patient mildly hypertensive otherwise with normal vital signs.  On physical exam there is a 5 cm laceration of the dorsum of the wrist without any obvious tenderness involvement.  Patient has active range of motion of the wrist and digits without difficulty.  Neurovascularly he is intact.  Given that this was a large piece of sheet metal, do not feel that imaging for foreign bodies is warranted.  Tetanus was updated and wound was repaired, see procedure note.  Patient was initiated  on Keflex for infection prophylaxis.  He is to have the sutures removed in 5 to 7 days.  Patient  amenable with plan, return precautions were discussed and he stable this time for outpatient management.      ____________________________________________   FINAL CLINICAL IMPRESSION(S) / ED DIAGNOSES  Final diagnoses:  Laceration of left hand without foreign body, initial encounter     ED Discharge Orders         Ordered    doxycycline (ADOXA) 100 MG tablet  2 times daily,   Status:  Discontinued        12/29/20 1743    cephALEXin (KEFLEX) 500 MG capsule  2 times daily        12/29/20 1759          *Please note:  Matthew Richardson was evaluated in Emergency Department on 12/30/2020 for the symptoms described in the history of present illness. He was evaluated in the context of the global COVID-19 pandemic, which necessitated consideration that the patient might be at risk for infection with the SARS-CoV-2 virus that causes COVID-19. Institutional protocols and algorithms that pertain to the evaluation of patients at risk for COVID-19 are in a state of rapid change based on information released by regulatory bodies including the CDC and federal and state organizations. These policies and algorithms were followed during the patient's care in the ED.  Some ED evaluations and interventions may be delayed as a result of limited staffing during and the pandemic.*   Note:  This document was prepared using Dragon voice recognition software and may include unintentional dictation errors.   Marlana Salvage, PA 12/30/20 Dairl Ponder    Blake Divine, MD 12/30/20 [phone]

## 2021-01-01 NOTE — Telephone Encounter (Signed)
Follow up message  Spouse calling, advised follow up appointment needed prior to any refills.  She verbalized frustration not being able to obtain refill.  Mrs Matthew Richardson requesting call from CMA/ Dr Ronnald Ramp

## 2021-01-01 NOTE — Telephone Encounter (Signed)
I have spoken to [place], and explained to her why the Rx was denied. She was upset but expressed understanding.

## 2021-01-27 ENCOUNTER — Encounter: Payer: Self-pay | Admitting: Internal Medicine

## 2021-01-27 ENCOUNTER — Ambulatory Visit: Payer: BC Managed Care – PPO | Admitting: Internal Medicine

## 2021-01-27 ENCOUNTER — Other Ambulatory Visit: Payer: Self-pay

## 2021-01-27 VITALS — BP 136/86 | HR 65 | Temp 97.6°F | Ht 71.0 in | Wt 189.0 lb

## 2021-01-27 DIAGNOSIS — R739 Hyperglycemia, unspecified: Secondary | ICD-10-CM

## 2021-01-27 DIAGNOSIS — K219 Gastro-esophageal reflux disease without esophagitis: Secondary | ICD-10-CM | POA: Diagnosis not present

## 2021-01-27 DIAGNOSIS — I5189 Other ill-defined heart diseases: Secondary | ICD-10-CM

## 2021-01-27 DIAGNOSIS — I10 Essential (primary) hypertension: Secondary | ICD-10-CM

## 2021-01-27 DIAGNOSIS — J4520 Mild intermittent asthma, uncomplicated: Secondary | ICD-10-CM

## 2021-01-27 DIAGNOSIS — I700 Atherosclerosis of aorta: Secondary | ICD-10-CM

## 2021-01-27 DIAGNOSIS — N5201 Erectile dysfunction due to arterial insufficiency: Secondary | ICD-10-CM

## 2021-01-27 DIAGNOSIS — Z0000 Encounter for general adult medical examination without abnormal findings: Secondary | ICD-10-CM

## 2021-01-27 LAB — BASIC METABOLIC PANEL
BUN: 15 mg/dL (ref 6–23)
CO2: 29 mEq/L (ref 19–32)
Calcium: 9.2 mg/dL (ref 8.4–10.5)
Chloride: 105 mEq/L (ref 96–112)
Creatinine, Ser: 1.05 mg/dL (ref 0.40–1.50)
GFR: 78.15 mL/min (ref >60.00)
Glucose, Bld: 94 mg/dL (ref 70–99)
Potassium: 4.2 mEq/L (ref 3.5–5.1)
Sodium: 140 mEq/L (ref 135–145)

## 2021-01-27 LAB — LIPID PANEL
Cholesterol: 200 mg/dL (ref 0–200)
HDL: 74.1 mg/dL (ref >39.00)
LDL Cholesterol: 116 mg/dL — ABNORMAL HIGH (ref 0–99)
NonHDL: 125.68
Total CHOL/HDL Ratio: 3
Triglycerides: 48 mg/dL (ref 0.0–149.0)
VLDL: 9.6 mg/dL (ref 0.0–40.0)

## 2021-01-27 LAB — HEMOGLOBIN A1C: Hgb A1c MFr Bld: 5.6 % (ref 4.6–6.5)

## 2021-01-27 LAB — PSA: PSA: 1.12 ng/mL (ref 0.10–4.00)

## 2021-01-27 MED ORDER — OMEPRAZOLE 40 MG PO CPDR: 40.0000 mg | DELAYED_RELEASE_CAPSULE | Freq: Every day | ORAL | 1 refills | Status: DC

## 2021-01-27 MED ORDER — TRELEGY ELLIPTA 100-62.5-25 MCG/INH IN AEPB
1.0000 | INHALATION_SPRAY | Freq: Every day | RESPIRATORY_TRACT | 1 refills | Status: DC
Start: 2021-01-27 — End: 2022-02-02

## 2021-01-27 MED ORDER — IRBESARTAN 150 MG PO TABS
150.0000 mg | ORAL_TABLET | Freq: Every day | ORAL | 1 refills | Status: DC
Start: 2021-01-27 — End: 2021-08-02

## 2021-01-27 MED ORDER — SILDENAFIL CITRATE 20 MG PO TABS: 80.0000 mg | ORAL_TABLET | Freq: Every day | ORAL | 3 refills | Status: DC | PRN

## 2021-01-27 NOTE — Patient Instructions (Signed)

## 2021-01-27 NOTE — Progress Notes (Signed)
Subjective:  Patient ID: Matthew Richardson, male    DOB: 01/21/1961  Age: 60 y.o. MRN: [id number]  CC: Annual Exam and Hypertension  This visit occurred during the SARS-CoV-2 public health emergency.  Safety protocols were in place, including screening questions prior to the visit, additional usage of staff PPE, and extensive cleaning of exam room while observing appropriate contact time as indicated for disinfecting solutions.    HPI Matthew Richardson presents for a CPX and f/up -    He is very active and denies any recent episodes of chest pain, shortness of breath, diaphoresis, dizziness, lightheadedness, DOE, edema, or fatigue.  Outpatient Medications Prior to Visit  Medication Sig Dispense Refill   albuterol (PROVENTIL HFA;VENTOLIN HFA) 108 (90 Base) MCG/ACT inhaler INHALE 2 PUFFS BY MOUTH EVERY 6 HOURS AS NEEDED FOR WHEEZING OR SHORTNESS OF BREATH (Patient taking differently: Inhale 2 puffs into the lungs every 6 (six) hours as needed for wheezing or shortness of breath.) 18 each 6   Cholecalciferol 50 MCG (2000 UT) TABS Take 1 tablet (2,000 Units total) by mouth daily. 90 tablet 1   ELDERBERRY PO Take 1 tablet by mouth daily.     zinc gluconate 50 MG tablet Take 50 mg by mouth daily.     irbesartan (AVAPRO) 150 MG tablet Take 1 tablet by mouth once daily (Patient taking differently: Take 150 mg by mouth daily.) 90 tablet 0   omeprazole (PRILOSEC) 40 MG capsule Take 1 capsule by mouth once daily (Patient taking differently: Take 40 mg by mouth daily.) 90 capsule 0   oxyCODONE (OXY IR/ROXICODONE) 5 MG immediate release tablet Take 1 tablet (5 mg total) by mouth every 6 (six) hours as needed for severe pain. 15 tablet 0   sildenafil (REVATIO) 20 MG tablet TAKE FOUR TABLETS BY MOUTH DAILY AS NEEDED (Patient taking differently: Take 80 mg by mouth daily as needed (ED).) 60 tablet 5   TRELEGY ELLIPTA 100-62.5-25 MCG/INH AEPB INHALE 1 PUFF INTO THE LUNGS DAILY (Patient taking differently: Inhale 1  puff into the lungs daily as needed (wheezing/allergy flare up to ragweed/mold).) 60 each 3   No facility-administered medications prior to visit.    ROS Review of Systems  Constitutional:  Negative for diaphoresis and fatigue.  HENT: Negative.    Eyes:  Negative for visual disturbance.  Respiratory:  Negative for cough, chest tightness, shortness of breath and wheezing.   Cardiovascular:  Negative for chest pain, palpitations and leg swelling.  Gastrointestinal:  Negative for abdominal pain, blood in stool, constipation, diarrhea, nausea and vomiting.  Endocrine: Negative.   Genitourinary: Negative.  Negative for difficulty urinating, scrotal swelling and testicular pain.  Musculoskeletal:  Negative for arthralgias and myalgias.  Skin: Negative.  Negative for color change and pallor.  Allergic/Immunologic: Negative.   Neurological: Negative.  Negative for dizziness, weakness and light-headedness.  Hematological:  Negative for adenopathy. Does not bruise/bleed easily.  Psychiatric/Behavioral: Negative.     Objective:  BP 136/86 (BP Location: Left Arm, Patient Position: Sitting, Cuff Size: Large)   Pulse 65   Temp 97.6 F (36.4 C) (Oral)   Ht 5\' 11"  (1.803 m)   Wt 189 lb (85.7 kg)   SpO2 96%   BMI 26.36 kg/m   BP Readings from Last 3 Encounters:  01/27/21 136/86  12/29/20 (!) 155/103  10/14/20 125/82    Wt Readings from Last 3 Encounters:  01/27/21 189 lb (85.7 kg)  12/29/20 200 lb (90.7 kg)  10/14/20 199 lb 15.3 oz (  90.7 kg)    Physical Exam Vitals reviewed.  Constitutional:      Appearance: Normal appearance.  HENT:     Nose: Nose normal.     Mouth/Throat:     Mouth: Mucous membranes are moist.  Eyes:     General: No scleral icterus.    Conjunctiva/sclera: Conjunctivae normal.  Cardiovascular:     Rate and Rhythm: Normal rate and regular rhythm.     Heart sounds: No murmur heard. Pulmonary:     Effort: Pulmonary effort is normal.     Breath sounds: No  stridor. No wheezing, rhonchi or rales.  Abdominal:     General: Abdomen is flat.     Palpations: There is no mass.     Tenderness: There is no abdominal tenderness. There is no guarding.     Hernia: No hernia is present. There is no hernia in the left inguinal area or right inguinal area.  Genitourinary:    Pubic Area: No rash.      Penis: Normal.      Testes: Normal.     Epididymis:     Right: Normal.     Left: Normal.     Prostate: Normal. Not enlarged, not tender and no nodules present.     Rectum: Normal. Guaiac result negative. No mass, tenderness, anal fissure, external hemorrhoid or internal hemorrhoid. Normal anal tone.  Musculoskeletal:        General: No swelling. Normal range of motion.     Cervical back: Neck supple.     Right lower leg: No edema.     Left lower leg: No edema.  Lymphadenopathy:     Cervical: No cervical adenopathy.     Lower Body: No right inguinal adenopathy. No left inguinal adenopathy.  Neurological:     Mental Status: He is alert.    Lab Results  Component Value Date   WBC 5.0 10/14/2020   HGB 14.2 10/14/2020   HCT 40.1 10/14/2020   PLT 175 10/14/2020   GLUCOSE 94 01/27/2021   CHOL 200 01/27/2021   TRIG 48.0 01/27/2021   HDL 74.10 01/27/2021   LDLDIRECT 136.5 03/13/2013   LDLCALC 116 (H) 01/27/2021   ALT 18 10/14/2020   AST 20 10/14/2020   NA 140 01/27/2021   K 4.2 01/27/2021   CL 105 01/27/2021   CREATININE 1.05 01/27/2021   BUN 15 01/27/2021   CO2 29 01/27/2021   TSH 1.17 05/29/2019   PSA 1.12 01/27/2021   INR 1.1 RATIO (H) 07/18/2008   HGBA1C 5.6 01/27/2021    No results found.  Assessment & Plan:   Matthew Richardson was seen today for annual exam and hypertension.  Diagnoses and all orders for this visit:  Essential hypertension- His blood pressure is adequately well controlled.  Will continue the current dose of the ARB. -     Basic metabolic panel; Future -     irbesartan (AVAPRO) 150 MG tablet; Take 1 tablet (150 mg total)  by mouth daily. -     Basic metabolic panel  Hyperglycemia- His blood sugar is normal now. -     Basic metabolic panel; Future -     Hemoglobin A1c; Future -     Hemoglobin A1c -     Basic metabolic panel  Routine general medical examination at a health care facility- Exam completed, labs reviewed - statin therapy is not indicated, he refused vaccines today, cancer screenings are up-to-date, patient education was given. -     Lipid panel; Future -  PSA; Future -     PSA -     Lipid panel  GERD without esophagitis- His symptoms are well controlled. -     omeprazole (PRILOSEC) 40 MG capsule; Take 1 capsule (40 mg total) by mouth daily.  Erectile dysfunction due to arterial insufficiency -     sildenafil (REVATIO) 20 MG tablet; Take 4 tablets (80 mg total) by mouth daily as needed (ED).  Grade I diastolic dysfunction -     irbesartan (AVAPRO) 150 MG tablet; Take 1 tablet (150 mg total) by mouth daily.  Atherosclerosis of aorta ([place])- He has a low ASCVD risk score so I did not recommend a statin for CV risk reduction.  Mild intermittent asthma without complication -     Fluticasone-Umeclidin-Vilant (TRELEGY ELLIPTA) 100-62.5-25 MCG/INH AEPB; Inhale 1 puff into the lungs daily.  I have discontinued Roselind Messier. Kooy's oxyCODONE. I have also changed his irbesartan, Trelegy Ellipta, sildenafil, and omeprazole. Additionally, I am having him maintain his albuterol, Cholecalciferol, zinc gluconate, and ELDERBERRY PO.  Meds ordered this encounter  Medications   irbesartan (AVAPRO) 150 MG tablet    Sig: Take 1 tablet (150 mg total) by mouth daily.    Dispense:  90 tablet    Refill:  1   Fluticasone-Umeclidin-Vilant (TRELEGY ELLIPTA) 100-62.5-25 MCG/INH AEPB    Sig: Inhale 1 puff into the lungs daily.    Dispense:  120 each    Refill:  1   sildenafil (REVATIO) 20 MG tablet    Sig: Take 4 tablets (80 mg total) by mouth daily as needed (ED).    Dispense:  60 tablet    Refill:  3    omeprazole (PRILOSEC) 40 MG capsule    Sig: Take 1 capsule (40 mg total) by mouth daily.    Dispense:  90 capsule    Refill:  1     Follow-up: Return in about 6 months (around 07/29/2021).  Scarlette Calico, MD

## 2021-01-29 ENCOUNTER — Telehealth: Payer: Self-pay

## 2021-01-29 NOTE — Telephone Encounter (Signed)
Key: [id number]

## 2021-02-01 NOTE — Telephone Encounter (Signed)
Per CoverMyMeds:  Your PA has been resolved, no additional PA is required.

## 2021-05-25 NOTE — Progress Notes (Deleted)
    Patient ID: Matthew Richardson, male    DOB: 03/11/1961, 60 y.o.   MRN: [id number]  HPI Male former smoker followed for allergic rhinitis, EIA, OSA/quit CPAP, complicated by kidney stone, GERD, hepatitis B, HBP NPSG 06/14/12- AHI 9.5 per hour, moderately loud snoring. Weight 190 pounds Office spirometry 10/19/16- WNL- FVC 4.46/ 87%, FEV1 3.29/ 84%, ratio 0.74, 25-75% 2.91/ 86% FENO 10/19/16- 15 (WNL)  ---------------------------------------------------------------------   05/28/20- 59 yoM fomer smoker followed for allergic rhinitis, EIAsthma, OSA/quit CPAP, complicated by kidney stone, Hep B, GERD, HTN, Atherosclerosis, Gr1DD, Albuterol hfa, Trelegy 100,  Still working at [employer]- none - discussed and recommended Flu vax- will get at [facility] well controlled with Trelegy. Rarely needs rescue inhaler.   05/28/21- 55 yoM fomer smoker followed for allergic rhinitis, EIAsthma, OSA/quit CPAP, complicated by kidney stone, Hep B, GERD, HTN, Atherosclerosis, Gr1DD, -Albuterol hfa, Trelegy 100,  Covid vax- Flu vax-    Review of Systems-see HPI sign = positive Constitutional:   No-   weight loss, night sweats, fevers, chills, fatigue, lassitude. HEENT:   No-  headaches, difficulty swallowing, tooth/dental problems, sore throat,       No-  sneezing, itching, ear ache, +nasal congestion, post nasal drip,  CV:  No-   chest pain, orthopnea, PND, swelling in lower extremities, anasarca, dizziness, palpitations Resp:  shortness of breath with exertion or at rest.              No-   productive cough,  No non-productive cough,  No-  coughing up of blood.              No-   change in color of mucus.   wheezing.   Skin: No-   rash or lesions. GI:  No-   heartburn, indigestion, abdominal pain, nausea, vomiting,  GU:  MS:  No-   joint pain or swelling.   Neuro- nothing unusual Psych:  No- change in mood or affect. No depression or anxiety.  No memory loss.     Objective:   Physical  Exam General- Alert, Oriented, Affect-appropriate, Distress- none acute; sinus bradycardia.  Skin- rash-none, lesions- none, excoriation- none.  Lymphadenopathy- none Head- atraumatic            Eyes- Gross vision intact, PERRLA, conjunctivae clear secretions            Ears- Hearing, canals normal            Nose- Clear, no-Septal dev, mucus, polyps, erosion, perforation             Throat- Mallampati III-IV , mucosa clear , drainage- none, tonsils- atrophic Neck- flexible , trachea midline, no stridor , thyroid nl, carotid no bruit Chest - symmetrical excursion , unlabored           Heart/CV- RRR , no murmur , no gallop  , no rub, nl s1 s2                           - JVD- none , edema- none, stasis changes- none, varices- none           Lung- clear, wheeze- none, cough- none , dullness-none, rub- none           Chest wall-  Abd-  Br/ Gen/ Rectal- Not done, not indicated Extrem- cyanosis- none, clubbing, none, atrophy- none, strength- nl Neuro- grossly intact to observation

## 2021-05-28 ENCOUNTER — Ambulatory Visit: Payer: BC Managed Care – PPO | Admitting: Internal Medicine

## 2021-06-24 ENCOUNTER — Ambulatory Visit: Payer: No Typology Code available for payment source | Admitting: Internal Medicine

## 2021-07-02 ENCOUNTER — Ambulatory Visit: Payer: No Typology Code available for payment source | Admitting: Internal Medicine

## 2021-07-09 NOTE — Progress Notes (Signed)
Patient ID: Matthew Richardson, male    DOB: September 05, 1960, 60 y.o.   MRN: [id number]  HPI Male former smoker followed for allergic rhinitis, EIA, OSA/quit CPAP, complicated by kidney stone, GERD, hepatitis B, HBP NPSG 06/14/12- AHI 9.5 per hour, moderately loud snoring. Weight 190 pounds Office spirometry 10/19/16- WNL- FVC 4.46/ 87%, FEV1 3.29/ 84%, ratio 0.74, 25-75% 2.91/ 86% FENO 10/19/16- 15 (WNL)  ---------------------------------------------------------------------   05/28/20- 60 yoM fomer smoker followed for allergic rhinitis, EIAsthma, OSA/quit CPAP, complicated by kidney stone, Hep B, GERD, HTN, Atherosclerosis, Gr1DD, Albuterol hfa, Trelegy 100,  Still working at [employer]- none - discussed and recommended Flu vax- will get at [facility] well controlled with Trelegy. Rarely needs rescue inhaler.   07/12/21- 60 yoM fomer smoker (20 pack year) followed for allergic rhinitis, EIAsthma, OSA/quit CPAP, complicated by kidney stone, Hep B, GERD, HTN, Atherosclerosis, Gr1DD, -Albuterol hfa, Trelegy 100,  Covid vax-none Flu vax-today Patient feels good overall, no concerns.  ACT score 25 Here with wife.  He is feeling very well, denying respiratory symptoms.  Has not felt need to use inhalers in a long time.  On review, he did smoke from 1 to 2 packs/day before quitting in 2003. We will check a chest x-ray today while he is here, but considering his stability and lack of acute needs, I suggested it would be simpler for him to be followed by his primary care physician who can refill inhalers if ever needed. He had minimal sleep apnea, did not tolerate CPAP, and has kept his weight down.  This issue can be revisited in the future if needed.  Review of Systems-see HPI sign = positive Constitutional:   No-   weight loss, night sweats, fevers, chills, fatigue, lassitude. HEENT:   No-  headaches, difficulty swallowing, tooth/dental problems, sore throat,       No-  sneezing, itching,  ear ache, +nasal congestion, post nasal drip,  CV:  No-   chest pain, orthopnea, PND, swelling in lower extremities, anasarca, dizziness, palpitations Resp:  shortness of breath with exertion or at rest.              No-   productive cough,  No non-productive cough,  No-  coughing up of blood.              No-   change in color of mucus.   wheezing.   Skin: No-   rash or lesions. GI:  No-   heartburn, indigestion, abdominal pain, nausea, vomiting,  GU:  MS:  No-   joint pain or swelling.   Neuro- nothing unusual Psych:  No- change in mood or affect. No depression or anxiety.  No memory loss.     Objective:   Physical Exam General- Alert, Oriented, Affect-appropriate, Distress- none acute; sinus bradycardia.  Skin- rash-none, lesions- none, excoriation- none.  Lymphadenopathy- none Head- atraumatic            Eyes- Gross vision intact, PERRLA, conjunctivae clear secretions            Ears- Hearing, canals normal            Nose- Clear, no-Septal dev, mucus, polyps, erosion, perforation             Throat- Mallampati III-IV , mucosa clear , drainage- none, tonsils- atrophic Neck- flexible , trachea midline, no stridor , thyroid nl, carotid no bruit Chest - symmetrical excursion , unlabored  Heart/CV- RRR , no murmur , no gallop  , no rub, nl s1 s2                           - JVD- none , edema- none, stasis changes- none, varices- none           Lung- clear, wheeze- none, cough- none , dullness-none, rub- none           Chest wall-  Abd-  Br/ Gen/ Rectal- Not done, not indicated Extrem- cyanosis- none, clubbing, none, atrophy- none, strength- nl Neuro- grossly intact to observation

## 2021-07-12 ENCOUNTER — Ambulatory Visit (INDEPENDENT_AMBULATORY_CARE_PROVIDER_SITE_OTHER): Payer: No Typology Code available for payment source | Admitting: Internal Medicine

## 2021-07-12 ENCOUNTER — Ambulatory Visit (INDEPENDENT_AMBULATORY_CARE_PROVIDER_SITE_OTHER): Payer: No Typology Code available for payment source

## 2021-07-12 ENCOUNTER — Encounter: Payer: Self-pay | Admitting: Internal Medicine

## 2021-07-12 ENCOUNTER — Other Ambulatory Visit: Payer: Self-pay

## 2021-07-12 VITALS — BP 128/90 | HR 54 | Temp 98.2°F | Ht 71.0 in | Wt 202.0 lb

## 2021-07-12 DIAGNOSIS — Z720 Tobacco use: Secondary | ICD-10-CM

## 2021-07-12 DIAGNOSIS — J4520 Mild intermittent asthma, uncomplicated: Secondary | ICD-10-CM

## 2021-07-12 DIAGNOSIS — Z23 Encounter for immunization: Secondary | ICD-10-CM | POA: Diagnosis not present

## 2021-07-12 NOTE — Assessment & Plan Note (Addendum)
This has been clinically inactive and he has not been using inhalers.  Those can be restarted in the future if appropriate.  Former smoker with no recent chest x-ray. Plan-flu vaccine, CXR

## 2021-07-12 NOTE — Patient Instructions (Signed)
Order- CXR    dx former tobacco user  Order- flu vax standard

## 2021-07-21 ENCOUNTER — Other Ambulatory Visit: Payer: Self-pay | Admitting: Internal Medicine

## 2021-07-21 DIAGNOSIS — K219 Gastro-esophageal reflux disease without esophagitis: Secondary | ICD-10-CM

## 2021-08-02 ENCOUNTER — Other Ambulatory Visit: Payer: Self-pay | Admitting: Internal Medicine

## 2021-08-02 DIAGNOSIS — I5189 Other ill-defined heart diseases: Secondary | ICD-10-CM

## 2021-08-02 DIAGNOSIS — I10 Essential (primary) hypertension: Secondary | ICD-10-CM

## 2021-10-19 ENCOUNTER — Other Ambulatory Visit: Payer: Self-pay | Admitting: Internal Medicine

## 2021-10-19 DIAGNOSIS — K219 Gastro-esophageal reflux disease without esophagitis: Secondary | ICD-10-CM

## 2021-11-04 ENCOUNTER — Other Ambulatory Visit: Payer: Self-pay | Admitting: Internal Medicine

## 2022-01-13 ENCOUNTER — Other Ambulatory Visit: Payer: Self-pay | Admitting: Internal Medicine

## 2022-01-13 DIAGNOSIS — K219 Gastro-esophageal reflux disease without esophagitis: Secondary | ICD-10-CM

## 2022-01-24 ENCOUNTER — Other Ambulatory Visit: Payer: Self-pay | Admitting: Internal Medicine

## 2022-01-24 DIAGNOSIS — N5201 Erectile dysfunction due to arterial insufficiency: Secondary | ICD-10-CM

## 2022-02-02 ENCOUNTER — Encounter: Payer: Self-pay | Admitting: Internal Medicine

## 2022-02-02 ENCOUNTER — Ambulatory Visit (INDEPENDENT_AMBULATORY_CARE_PROVIDER_SITE_OTHER): Payer: No Typology Code available for payment source | Admitting: Internal Medicine

## 2022-02-02 VITALS — BP 128/82 | HR 61 | Temp 98.3°F | Resp 16 | Ht 71.0 in | Wt 198.0 lb

## 2022-02-02 DIAGNOSIS — I10 Essential (primary) hypertension: Secondary | ICD-10-CM | POA: Diagnosis not present

## 2022-02-02 DIAGNOSIS — Z136 Encounter for screening for cardiovascular disorders: Secondary | ICD-10-CM

## 2022-02-02 DIAGNOSIS — Z0000 Encounter for general adult medical examination without abnormal findings: Secondary | ICD-10-CM

## 2022-02-02 DIAGNOSIS — K219 Gastro-esophageal reflux disease without esophagitis: Secondary | ICD-10-CM

## 2022-02-02 DIAGNOSIS — Z8619 Personal history of other infectious and parasitic diseases: Secondary | ICD-10-CM | POA: Diagnosis not present

## 2022-02-02 DIAGNOSIS — Z125 Encounter for screening for malignant neoplasm of prostate: Secondary | ICD-10-CM | POA: Diagnosis not present

## 2022-02-02 DIAGNOSIS — I700 Atherosclerosis of aorta: Secondary | ICD-10-CM | POA: Diagnosis not present

## 2022-02-02 DIAGNOSIS — N5201 Erectile dysfunction due to arterial insufficiency: Secondary | ICD-10-CM

## 2022-02-02 DIAGNOSIS — R001 Bradycardia, unspecified: Secondary | ICD-10-CM | POA: Diagnosis not present

## 2022-02-02 DIAGNOSIS — Z23 Encounter for immunization: Secondary | ICD-10-CM

## 2022-02-02 LAB — HEPATIC FUNCTION PANEL
ALT: 16 U/L (ref 0–53)
AST: 17 U/L (ref 0–37)
Albumin: 4.3 g/dL (ref 3.5–5.2)
Alkaline Phosphatase: 49 U/L (ref 39–117)
Bilirubin, Direct: 0.1 mg/dL (ref 0.0–0.3)
Total Bilirubin: 0.7 mg/dL (ref 0.2–1.2)
Total Protein: 6.7 g/dL (ref 6.0–8.3)

## 2022-02-02 LAB — URINALYSIS, ROUTINE W REFLEX MICROSCOPIC
Bilirubin Urine: NEGATIVE
Ketones, ur: NEGATIVE
Leukocytes,Ua: NEGATIVE
Nitrite: NEGATIVE
Specific Gravity, Urine: <=1.005 — AB (ref 1.000–1.030)
Total Protein, Urine: NEGATIVE
Urine Glucose: NEGATIVE
Urobilinogen, UA: 0.2 (ref 0.0–1.0)
pH: 6 (ref 5.0–8.0)

## 2022-02-02 LAB — BASIC METABOLIC PANEL
BUN: 21 mg/dL (ref 6–23)
CO2: 27 mEq/L (ref 19–32)
Calcium: 9.1 mg/dL (ref 8.4–10.5)
Chloride: 103 mEq/L (ref 96–112)
Creatinine, Ser: 0.99 mg/dL (ref 0.40–1.50)
GFR: 83.27 mL/min (ref >60.00)
Glucose, Bld: 117 mg/dL — ABNORMAL HIGH (ref 70–99)
Potassium: 4.1 mEq/L (ref 3.5–5.1)
Sodium: 139 mEq/L (ref 135–145)

## 2022-02-02 LAB — PSA: PSA: 1.37 ng/mL (ref 0.10–4.00)

## 2022-02-02 LAB — CBC WITH DIFFERENTIAL/PLATELET
Basophils Absolute: 0 10*3/uL (ref 0.0–0.1)
Basophils Relative: 0.5 % (ref 0.0–3.0)
Eosinophils Absolute: 0.2 10*3/uL (ref 0.0–0.7)
Eosinophils Relative: 3.4 % (ref 0.0–5.0)
HCT: 42.7 % (ref 39.0–52.0)
Hemoglobin: 15.2 g/dL (ref 13.0–17.0)
Lymphocytes Relative: 27.4 % (ref 12.0–46.0)
Lymphs Abs: 1.5 10*3/uL (ref 0.7–4.0)
MCHC: 35.5 g/dL (ref 30.0–36.0)
MCV: 86.4 fl (ref 78.0–100.0)
Monocytes Absolute: 0.3 10*3/uL (ref 0.1–1.0)
Monocytes Relative: 6.3 % (ref 3.0–12.0)
Neutro Abs: 3.5 10*3/uL (ref 1.4–7.7)
Neutrophils Relative %: 62.4 % (ref 43.0–77.0)
Platelets: 218 10*3/uL (ref 150.0–400.0)
RBC: 4.94 Mil/uL (ref 4.22–5.81)
RDW: 13.3 % (ref 11.5–15.5)
WBC: 5.5 10*3/uL (ref 4.0–10.5)

## 2022-02-02 LAB — TSH: TSH: 1.34 u[IU]/mL (ref 0.35–5.50)

## 2022-02-02 LAB — LIPID PANEL
Cholesterol: 212 mg/dL — ABNORMAL HIGH (ref 0–200)
HDL: 77.2 mg/dL (ref >39.00)
LDL Cholesterol: 125 mg/dL — ABNORMAL HIGH (ref 0–99)
NonHDL: 134.84
Total CHOL/HDL Ratio: 3
Triglycerides: 51 mg/dL (ref 0.0–149.0)
VLDL: 10.2 mg/dL (ref 0.0–40.0)

## 2022-02-02 MED ORDER — SILDENAFIL CITRATE 20 MG PO TABS: 80.0000 mg | ORAL_TABLET | Freq: Every day | ORAL | 5 refills | Status: DC | PRN

## 2022-02-02 NOTE — Patient Instructions (Signed)
Health Maintenance, Male Adopting a healthy lifestyle and getting preventive care are important in promoting health and wellness. Ask your health care provider about: The right schedule for you to have regular tests and exams. Things you can do on your own to prevent diseases and keep yourself healthy. What should I know about diet, weight, and exercise? Eat a healthy diet  Eat a diet that includes plenty of vegetables, fruits, low-fat dairy products, and lean protein. Do not eat a lot of foods that are high in solid fats, added sugars, or sodium. Maintain a healthy weight Body mass index (BMI) is a measurement that can be used to identify possible weight problems. It estimates body fat based on height and weight. Your health care provider can help determine your BMI and help you achieve or maintain a healthy weight. Get regular exercise Get regular exercise. This is one of the most important things you can do for your health. Most adults should: Exercise for at least 150 minutes each week. The exercise should increase your heart rate and make you sweat (moderate-intensity exercise). Do strengthening exercises at least twice a week. This is in addition to the moderate-intensity exercise. Spend less time sitting. Even light physical activity can be beneficial. Watch cholesterol and blood lipids Have your blood tested for lipids and cholesterol at 60 years of age, then have this test every 5 years. You may need to have your cholesterol levels checked more often if: Your lipid or cholesterol levels are high. You are older than 60 years of age. You are at high risk for heart disease. What should I know about cancer screening? Many types of cancers can be detected early and may often be prevented. Depending on your health history and family history, you may need to have cancer screening at various ages. This may include screening for: Colorectal cancer. Prostate cancer. Skin cancer. Lung  cancer. What should I know about heart disease, diabetes, and high blood pressure? Blood pressure and heart disease High blood pressure causes heart disease and increases the risk of stroke. This is more likely to develop in people who have high blood pressure readings or are overweight. Talk with your health care provider about your target blood pressure readings. Have your blood pressure checked: Every 3-5 years if you are 18-39 years of age. Every year if you are 40 years old or older. If you are between the ages of 65 and 75 and are a current or former smoker, ask your health care provider if you should have a one-time screening for abdominal aortic aneurysm (AAA). Diabetes Have regular diabetes screenings. This checks your fasting blood sugar level. Have the screening done: Once every three years after age 45 if you are at a normal weight and have a low risk for diabetes. More often and at a younger age if you are overweight or have a high risk for diabetes. What should I know about preventing infection? Hepatitis B If you have a higher risk for hepatitis B, you should be screened for this virus. Talk with your health care provider to find out if you are at risk for hepatitis B infection. Hepatitis C Blood testing is recommended for: Everyone born from 1945 through 1965. Anyone with known risk factors for hepatitis C. Sexually transmitted infections (STIs) You should be screened each year for STIs, including gonorrhea and chlamydia, if: You are sexually active and are younger than 60 years of age. You are older than 60 years of age and your   health care provider tells you that you are at risk for this type of infection. Your sexual activity has changed since you were last screened, and you are at increased risk for chlamydia or gonorrhea. Ask your health care provider if you are at risk. Ask your health care provider about whether you are at high risk for HIV. Your health care provider  may recommend a prescription medicine to help prevent HIV infection. If you choose to take medicine to prevent HIV, you should first get tested for HIV. You should then be tested every 3 months for as long as you are taking the medicine. Follow these instructions at home: Alcohol use Do not drink alcohol if your health care provider tells you not to drink. If you drink alcohol: Limit how much you have to 0-2 drinks a day. Know how much alcohol is in your drink. In the [place] one drink equals one 12 oz bottle of beer (355 mL), one 5 oz glass of wine (148 mL), or one 1 oz glass of hard liquor (44 mL). Lifestyle Do not use any products that contain nicotine or tobacco. These products include cigarettes, chewing tobacco, and vaping devices, such as e-cigarettes. If you need help quitting, ask your health care provider. Do not use street drugs. Do not share needles. Ask your health care provider for help if you need support or information about quitting drugs. General instructions Schedule regular health, dental, and eye exams. Stay current with your vaccines. Tell your health care provider if: You often feel depressed. You have ever been abused or do not feel safe at home. Summary Adopting a healthy lifestyle and getting preventive care are important in promoting health and wellness. Follow your health care provider's instructions about healthy diet, exercising, and getting tested or screened for diseases. Follow your health care provider's instructions on monitoring your cholesterol and blood pressure. This information is not intended to replace advice given to you by your health care provider. Make sure you discuss any questions you have with your health care provider. Document Revised: 12/28/2020 Document Reviewed: 12/28/2020 Elsevier Patient Education  2023 Elsevier Inc.  

## 2022-02-02 NOTE — Progress Notes (Signed)
Subjective:  Patient ID: Matthew Richardson, male    DOB: [birth year]  Age: 61 y.o. MRN: [id number]  CC: Annual Exam and Hypertension   HPI Matthew Richardson presents for a CPX and f/up -   He is very active and denies chest pain, shortness of breath, diaphoresis, dizziness, lightheadedness, edema, or fatigue.  Outpatient Medications Prior to Visit  Medication Sig Dispense Refill   Cholecalciferol 50 MCG (2000 UT) TABS Take 1 tablet (2,000 Units total) by mouth daily. 90 tablet 1   ELDERBERRY PO Take 1 tablet by mouth daily.     irbesartan (AVAPRO) 150 MG tablet Take 1 tablet by mouth once daily 90 tablet 0   omeprazole (PRILOSEC) 40 MG capsule Take 1 capsule by mouth once daily 90 capsule 0   zinc gluconate 50 MG tablet Take 50 mg by mouth daily.     albuterol (PROVENTIL HFA;VENTOLIN HFA) 108 (90 Base) MCG/ACT inhaler INHALE 2 PUFFS BY MOUTH EVERY 6 HOURS AS NEEDED FOR WHEEZING OR SHORTNESS OF BREATH (Patient taking differently: Inhale 2 puffs into the lungs every 6 (six) hours as needed for wheezing or shortness of breath.) 18 each 6   Fluticasone-Umeclidin-Vilant (TRELEGY ELLIPTA) 100-62.5-25 MCG/INH AEPB Inhale 1 puff into the lungs daily. 120 each 1   sildenafil (REVATIO) 20 MG tablet TAKE 4 TABLETS BY MOUTH ONCE DAILY AS NEEDED (ED) 60 tablet 0   No facility-administered medications prior to visit.    ROS Review of Systems  Constitutional: Negative.  Negative for chills, diaphoresis, fatigue and fever.  HENT: Negative.    Eyes: Negative.   Respiratory: Negative.  Negative for cough, chest tightness, shortness of breath and wheezing.   Cardiovascular:  Negative for chest pain, palpitations and leg swelling.  Gastrointestinal:  Negative for abdominal pain, diarrhea, nausea and vomiting.  Endocrine: Negative.   Genitourinary: Negative.  Negative for difficulty urinating, dysuria and hematuria.       ++ED  Musculoskeletal: Negative.   Skin: Negative.  Negative for color change.   Neurological:  Negative for dizziness, syncope, weakness, light-headedness and headaches.  Hematological:  Negative for adenopathy. Does not bruise/bleed easily.  Psychiatric/Behavioral: Negative.      Objective:  BP 128/82 (BP Location: Left Arm, Patient Position: Sitting, Cuff Size: Large)   Pulse 61   Temp 98.3 F (36.8 C) (Oral)   Resp 16   Ht '5\' 11"'$  (1.803 m)   Wt 198 lb (89.8 kg)   SpO2 96%   BMI 27.62 kg/m   BP Readings from Last 3 Encounters:  02/02/22 128/82  07/12/21 128/90  01/27/21 136/86    Wt Readings from Last 3 Encounters:  02/02/22 198 lb (89.8 kg)  07/12/21 202 lb (91.6 kg)  01/27/21 189 lb (85.7 kg)    Physical Exam Vitals reviewed.  HENT:     Nose: Nose normal.     Mouth/Throat:     Mouth: Mucous membranes are moist.  Eyes:     General: No scleral icterus.    Conjunctiva/sclera: Conjunctivae normal.  Cardiovascular:     Rate and Rhythm: Regular rhythm. Bradycardia present.     Heart sounds: No murmur heard.    Comments: EKG- SB, 50 bpm RBBB, septal infarct pattern, lateral TWI are old No LVH Pulmonary:     Effort: Pulmonary effort is normal.     Breath sounds: No stridor. No wheezing, rhonchi or rales.  Abdominal:     General: Abdomen is flat.     Palpations: There is no mass.  Tenderness: There is no abdominal tenderness. There is no guarding.     Hernia: No hernia is present.  Musculoskeletal:     Right lower leg: No edema.     Left lower leg: No edema.  Skin:    General: Skin is warm and dry.  Neurological:     General: No focal deficit present.     Mental Status: He is alert. Mental status is at baseline.  Psychiatric:        Mood and Affect: Mood normal.        Behavior: Behavior normal.     Lab Results  Component Value Date   WBC 5.5 02/02/2022   HGB 15.2 02/02/2022   HCT 42.7 02/02/2022   PLT 218.0 02/02/2022   GLUCOSE 117 (H) 02/02/2022   CHOL 212 (H) 02/02/2022   TRIG 51.0 02/02/2022   HDL 77.20 02/02/2022    LDLDIRECT 136.5 03/13/2013   LDLCALC 125 (H) 02/02/2022   ALT 16 02/02/2022   AST 17 02/02/2022   NA 139 02/02/2022   K 4.1 02/02/2022   CL 103 02/02/2022   CREATININE 0.99 02/02/2022   BUN 21 02/02/2022   CO2 27 02/02/2022   TSH 1.34 02/02/2022   PSA 1.37 02/02/2022   INR 1.1 RATIO (H) 07/18/2008   HGBA1C 5.6 01/27/2021    No results found.  Assessment & Plan:   Matthew Richardson was seen today for annual exam and hypertension.  Diagnoses and all orders for this visit:  Essential hypertension- His blood pressure is adequately well controlled. -     Basic metabolic panel; Future -     CBC with Differential/Platelet; Future -     Hepatic function panel; Future -     Urinalysis, Routine w reflex microscopic; Future -     EKG 12-Lead -     TSH; Future -     TSH -     Urinalysis, Routine w reflex microscopic -     Hepatic function panel -     CBC with Differential/Platelet -     Basic metabolic panel  Atherosclerosis of aorta (HCC)  GERD without esophagitis- His symptoms are well controlled. -     CBC with Differential/Platelet; Future -     CBC with Differential/Platelet  Routine general medical examination at a health care facility- Exam completed, labs reviewed, vaccines are up-to-date, cancer screenings are up-to-date, patient education was given. -     Lipid panel; Future -     PSA; Future -     PSA -     Lipid panel  History of hepatitis B virus infection- Hep B antigen remains negative. -     Hepatitis B surface antigen; Future -     Hepatitis B surface antigen  Bradycardia, sinus- This is chronic, stable, and asymptomatic. -     TSH; Future -     TSH  Erectile dysfunction due to arterial insufficiency -     sildenafil (REVATIO) 20 MG tablet; Take 4 tablets (80 mg total) by mouth daily as needed.  Other orders -     Varicella-zoster vaccine IM (Shingrix)   I have discontinued Roselind Messier. Mohl's albuterol and Trelegy Ellipta. I have also changed his sildenafil.  Additionally, I am having him maintain his Cholecalciferol, zinc gluconate, ELDERBERRY PO, irbesartan, and omeprazole.  Meds ordered this encounter  Medications   sildenafil (REVATIO) 20 MG tablet    Sig: Take 4 tablets (80 mg total) by mouth daily as needed.    Dispense:  60 tablet    Refill:  5     Follow-up: Return in about 6 months (around 08/04/2022).  Scarlette Calico, MD

## 2022-02-03 LAB — HEPATITIS B SURFACE ANTIGEN: Hepatitis B Surface Ag: NONREACTIVE

## 2022-02-15 ENCOUNTER — Other Ambulatory Visit: Payer: Self-pay | Admitting: Internal Medicine

## 2022-02-15 DIAGNOSIS — I5189 Other ill-defined heart diseases: Secondary | ICD-10-CM

## 2022-02-15 DIAGNOSIS — I10 Essential (primary) hypertension: Secondary | ICD-10-CM

## 2022-04-18 ENCOUNTER — Other Ambulatory Visit: Payer: Self-pay | Admitting: Internal Medicine

## 2022-04-18 DIAGNOSIS — K219 Gastro-esophageal reflux disease without esophagitis: Secondary | ICD-10-CM

## 2022-07-01 ENCOUNTER — Ambulatory Visit: Payer: No Typology Code available for payment source

## 2022-07-01 ENCOUNTER — Ambulatory Visit (INDEPENDENT_AMBULATORY_CARE_PROVIDER_SITE_OTHER): Payer: No Typology Code available for payment source

## 2022-07-01 DIAGNOSIS — Z23 Encounter for immunization: Secondary | ICD-10-CM | POA: Diagnosis not present

## 2022-07-01 NOTE — Progress Notes (Deleted)
B12 given.  Pt tolerated well. Pt is aware to give the office a call for an side effects or reactions. Please co-sign.   

## 2022-07-21 ENCOUNTER — Other Ambulatory Visit: Payer: Self-pay | Admitting: Internal Medicine

## 2022-07-21 DIAGNOSIS — K219 Gastro-esophageal reflux disease without esophagitis: Secondary | ICD-10-CM

## 2022-08-02 ENCOUNTER — Ambulatory Visit (INDEPENDENT_AMBULATORY_CARE_PROVIDER_SITE_OTHER): Payer: No Typology Code available for payment source | Admitting: Internal Medicine

## 2022-08-02 ENCOUNTER — Encounter: Payer: Self-pay | Admitting: Internal Medicine

## 2022-08-02 VITALS — BP 156/94 | HR 50 | Temp 98.1°F | Resp 16 | Ht 71.0 in | Wt 198.0 lb

## 2022-08-02 DIAGNOSIS — R001 Bradycardia, unspecified: Secondary | ICD-10-CM | POA: Diagnosis not present

## 2022-08-02 DIAGNOSIS — I10 Essential (primary) hypertension: Secondary | ICD-10-CM

## 2022-08-02 DIAGNOSIS — R739 Hyperglycemia, unspecified: Secondary | ICD-10-CM | POA: Insufficient documentation

## 2022-08-02 DIAGNOSIS — Z23 Encounter for immunization: Secondary | ICD-10-CM | POA: Diagnosis not present

## 2022-08-02 LAB — URINALYSIS, ROUTINE W REFLEX MICROSCOPIC
Bilirubin Urine: NEGATIVE
Hgb urine dipstick: NEGATIVE
Ketones, ur: NEGATIVE
Leukocytes,Ua: NEGATIVE
Nitrite: NEGATIVE
RBC / HPF: NONE SEEN (ref 0–?)
Specific Gravity, Urine: <=1.005 — AB (ref 1.000–1.030)
Total Protein, Urine: NEGATIVE
Urine Glucose: NEGATIVE
Urobilinogen, UA: 0.2 (ref 0.0–1.0)
WBC, UA: NONE SEEN (ref 0–?)
pH: 7 (ref 5.0–8.0)

## 2022-08-02 LAB — BASIC METABOLIC PANEL
BUN: 17 mg/dL (ref 6–23)
CO2: 29 mEq/L (ref 19–32)
Calcium: 9.6 mg/dL (ref 8.4–10.5)
Chloride: 101 mEq/L (ref 96–112)
Creatinine, Ser: 1.04 mg/dL (ref 0.40–1.50)
GFR: 78.22 mL/min (ref >60.00)
Glucose, Bld: 121 mg/dL — ABNORMAL HIGH (ref 70–99)
Potassium: 4.2 mEq/L (ref 3.5–5.1)
Sodium: 137 mEq/L (ref 135–145)

## 2022-08-02 LAB — HEMOGLOBIN A1C: Hgb A1c MFr Bld: 5.7 % (ref 4.6–6.5)

## 2022-08-02 MED ORDER — INDAPAMIDE 1.25 MG PO TABS: 1.2500 mg | ORAL_TABLET | Freq: Every day | ORAL | 0 refills | Status: DC

## 2022-08-02 MED ORDER — IRBESARTAN 300 MG PO TABS: 300.0000 mg | ORAL_TABLET | Freq: Every day | ORAL | 0 refills | Status: DC

## 2022-08-02 NOTE — Progress Notes (Signed)
Subjective:  Patient ID: Matthew Richardson, male    DOB: 12-14-1960  Age: 61 y.o. MRN: [id number]  CC: Hypertension   HPI BURNEY CALZADILLA presents for f/up -   He walks on a treadmill for about 5 miles a day.  He denies chest pain, shortness of breath, diaphoresis, or edema.  He tells me he is taking the ARB.  He had 250 mg of caffeine this morning in an energy drink.  Outpatient Medications Prior to Visit  Medication Sig Dispense Refill   Cholecalciferol 50 MCG (2000 UT) TABS Take 1 tablet (2,000 Units total) by mouth daily. 90 tablet 1   ELDERBERRY PO Take 1 tablet by mouth daily.     omeprazole (PRILOSEC) 40 MG capsule Take 1 capsule by mouth once daily 90 capsule 0   sildenafil (REVATIO) 20 MG tablet Take 4 tablets (80 mg total) by mouth daily as needed. 60 tablet 5   zinc gluconate 50 MG tablet Take 50 mg by mouth daily.     irbesartan (AVAPRO) 150 MG tablet Take 1 tablet by mouth once daily 90 tablet 1   No facility-administered medications prior to visit.    ROS Review of Systems  Constitutional: Negative.  Negative for chills, diaphoresis, fatigue and unexpected weight change.  HENT: Negative.    Eyes: Negative.   Respiratory:  Negative for cough, chest tightness, shortness of breath and wheezing.   Cardiovascular:  Negative for chest pain, palpitations and leg swelling.  Gastrointestinal:  Negative for abdominal pain, diarrhea, nausea and vomiting.  Endocrine: Negative.   Genitourinary: Negative.  Negative for difficulty urinating.  Musculoskeletal: Negative.   Skin: Negative.   Neurological: Negative.  Negative for dizziness, syncope, weakness and light-headedness.  Hematological:  Negative for adenopathy. Does not bruise/bleed easily.  Psychiatric/Behavioral: Negative.      Objective:  BP (!) 156/94 (BP Location: Left Arm, Patient Position: Sitting, Cuff Size: Large)   Pulse 60   Temp 98.1 F (36.7 C) (Oral)   Resp 16   Ht '5\' 11"'$  (1.803 m)   Wt 198 lb (89.8 kg)    SpO2 98%   BMI 27.62 kg/m   BP Readings from Last 3 Encounters:  08/02/22 (!) 156/94  02/02/22 128/82  07/12/21 128/90    Wt Readings from Last 3 Encounters:  08/02/22 198 lb (89.8 kg)  02/02/22 198 lb (89.8 kg)  07/12/21 202 lb (91.6 kg)    Physical Exam Vitals reviewed.  HENT:     Mouth/Throat:     Mouth: Mucous membranes are moist.  Eyes:     General: No scleral icterus.    Conjunctiva/sclera: Conjunctivae normal.  Cardiovascular:     Rate and Rhythm: Regular rhythm. Bradycardia present.     Heart sounds: Normal heart sounds, S1 normal and S2 normal. No murmur heard.    No gallop.     Comments: EKG- SB, 49 bpm LAD, incomplete RBBB, septal infarct pattern are old unchanged Pulmonary:     Effort: Pulmonary effort is normal.     Breath sounds: No stridor. No wheezing, rhonchi or rales.  Abdominal:     General: Abdomen is flat.     Palpations: There is no mass.     Tenderness: There is no abdominal tenderness. There is no guarding.     Hernia: No hernia is present.  Musculoskeletal:        General: Normal range of motion.     Cervical back: Neck supple.     Right lower leg:  No edema.     Left lower leg: No edema.  Lymphadenopathy:     Cervical: No cervical adenopathy.  Neurological:     General: No focal deficit present.     Mental Status: He is alert.  Psychiatric:        Mood and Affect: Mood normal.        Behavior: Behavior normal.     Lab Results  Component Value Date   WBC 5.5 02/02/2022   HGB 15.2 02/02/2022   HCT 42.7 02/02/2022   PLT 218.0 02/02/2022   GLUCOSE 121 (H) 08/02/2022   CHOL 212 (H) 02/02/2022   TRIG 51.0 02/02/2022   HDL 77.20 02/02/2022   LDLDIRECT 136.5 03/13/2013   LDLCALC 125 (H) 02/02/2022   ALT 16 02/02/2022   AST 17 02/02/2022   NA 137 08/02/2022   K 4.2 08/02/2022   CL 101 08/02/2022   CREATININE 1.04 08/02/2022   BUN 17 08/02/2022   CO2 29 08/02/2022   TSH 1.34 02/02/2022   PSA 1.37 02/02/2022   INR 1.1 RATIO  (H) 07/18/2008   HGBA1C 5.7 08/02/2022    No results found.  Assessment & Plan:   Lucus was seen today for hypertension.  Diagnoses and all orders for this visit:  Bradycardia on ECG- He is asymptomatic with this.  Labs are negative for secondary causes. -     EKG 12-Lead  Essential hypertension- EKG is negative for LVH. His blood pressure is not at goal.  He will continue working on his lifestyle modification and agrees to decrease his caffeine intake.  Will increase the dose of the ARB and add a thiazide diuretic. -     EKG 12-Lead -     irbesartan (AVAPRO) 300 MG tablet; Take 1 tablet (300 mg total) by mouth daily. -     indapamide (LOZOL) 1.25 MG tablet; Take 1 tablet (1.25 mg total) by mouth daily. -     Urinalysis, Routine w reflex microscopic; Future -     Basic metabolic panel; Future -     Aldosterone + renin activity w/ ratio; Future -     Aldosterone + renin activity w/ ratio -     Basic metabolic panel -     Urinalysis, Routine w reflex microscopic  Chronic hyperglycemia -     Hemoglobin A1c; Future -     Basic metabolic panel; Future -     Basic metabolic panel -     Hemoglobin A1c  Other orders -     Flu Vaccine QUAD 6+ mos PF IM (Fluarix Quad PF)   I have discontinued Roselind Messier. Nobles's irbesartan. I am also having him start on irbesartan and indapamide. Additionally, I am having him maintain his Cholecalciferol, zinc gluconate, ELDERBERRY PO, sildenafil, and omeprazole.  Meds ordered this encounter  Medications   irbesartan (AVAPRO) 300 MG tablet    Sig: Take 1 tablet (300 mg total) by mouth daily.    Dispense:  90 tablet    Refill:  0   indapamide (LOZOL) 1.25 MG tablet    Sig: Take 1 tablet (1.25 mg total) by mouth daily.    Dispense:  90 tablet    Refill:  0     Follow-up: Return in about 6 weeks (around 09/13/2022).  Scarlette Calico, MD

## 2022-08-06 LAB — ALDOSTERONE + RENIN ACTIVITY W/ RATIO
ALDO / PRA Ratio: 0.4 Ratio — ABNORMAL LOW (ref 0.9–28.9)
Aldosterone: 5 ng/dL
Renin Activity: 12.77 ng/mL/h — ABNORMAL HIGH (ref 0.25–5.82)

## 2022-08-08 ENCOUNTER — Telehealth: Payer: Self-pay | Admitting: Internal Medicine

## 2022-08-08 NOTE — Telephone Encounter (Signed)
Pt is requesting we send him a copy of his most recent labs after Dr. Ronnald Ramp reviews them.    Pt info:  [phone]   [id number]  [place] [id number]

## 2022-08-10 ENCOUNTER — Encounter: Payer: Self-pay | Admitting: Internal Medicine

## 2022-08-10 ENCOUNTER — Other Ambulatory Visit: Payer: Self-pay | Admitting: Internal Medicine

## 2022-10-20 ENCOUNTER — Other Ambulatory Visit: Payer: Self-pay | Admitting: Internal Medicine

## 2022-10-20 DIAGNOSIS — K219 Gastro-esophageal reflux disease without esophagitis: Secondary | ICD-10-CM

## 2022-11-28 ENCOUNTER — Other Ambulatory Visit: Payer: Self-pay | Admitting: Internal Medicine

## 2022-11-28 DIAGNOSIS — N5201 Erectile dysfunction due to arterial insufficiency: Secondary | ICD-10-CM

## 2023-01-20 ENCOUNTER — Other Ambulatory Visit: Payer: Self-pay | Admitting: Internal Medicine

## 2023-01-20 DIAGNOSIS — I10 Essential (primary) hypertension: Secondary | ICD-10-CM

## 2023-01-20 DIAGNOSIS — K219 Gastro-esophageal reflux disease without esophagitis: Secondary | ICD-10-CM

## 2023-01-23 ENCOUNTER — Other Ambulatory Visit: Payer: Self-pay | Admitting: Internal Medicine

## 2023-01-23 DIAGNOSIS — I10 Essential (primary) hypertension: Secondary | ICD-10-CM

## 2023-01-23 DIAGNOSIS — K219 Gastro-esophageal reflux disease without esophagitis: Secondary | ICD-10-CM

## 2023-02-13 ENCOUNTER — Encounter: Payer: Self-pay | Admitting: Internal Medicine

## 2023-02-13 ENCOUNTER — Ambulatory Visit (INDEPENDENT_AMBULATORY_CARE_PROVIDER_SITE_OTHER): Payer: No Typology Code available for payment source | Admitting: Internal Medicine

## 2023-02-13 VITALS — BP 134/82 | HR 49 | Temp 98.0°F | Resp 16 | Ht 71.0 in | Wt 205.0 lb

## 2023-02-13 DIAGNOSIS — L989 Disorder of the skin and subcutaneous tissue, unspecified: Secondary | ICD-10-CM | POA: Insufficient documentation

## 2023-02-13 DIAGNOSIS — E785 Hyperlipidemia, unspecified: Secondary | ICD-10-CM

## 2023-02-13 DIAGNOSIS — I10 Essential (primary) hypertension: Secondary | ICD-10-CM

## 2023-02-13 DIAGNOSIS — I5189 Other ill-defined heart diseases: Secondary | ICD-10-CM | POA: Diagnosis not present

## 2023-02-13 DIAGNOSIS — R9431 Abnormal electrocardiogram [ECG] [EKG]: Secondary | ICD-10-CM | POA: Diagnosis not present

## 2023-02-13 DIAGNOSIS — Z0000 Encounter for general adult medical examination without abnormal findings: Secondary | ICD-10-CM | POA: Diagnosis not present

## 2023-02-13 DIAGNOSIS — N5201 Erectile dysfunction due to arterial insufficiency: Secondary | ICD-10-CM | POA: Diagnosis not present

## 2023-02-13 DIAGNOSIS — I700 Atherosclerosis of aorta: Secondary | ICD-10-CM | POA: Diagnosis not present

## 2023-02-13 DIAGNOSIS — R739 Hyperglycemia, unspecified: Secondary | ICD-10-CM | POA: Diagnosis not present

## 2023-02-13 DIAGNOSIS — E7841 Elevated Lipoprotein(a): Secondary | ICD-10-CM

## 2023-02-13 LAB — URINALYSIS, ROUTINE W REFLEX MICROSCOPIC
Bilirubin Urine: NEGATIVE
Ketones, ur: NEGATIVE
Leukocytes,Ua: NEGATIVE
Nitrite: NEGATIVE
Specific Gravity, Urine: 1.015 (ref 1.000–1.030)
Total Protein, Urine: NEGATIVE
Urine Glucose: NEGATIVE
Urobilinogen, UA: 0.2 (ref 0.0–1.0)
pH: 6 (ref 5.0–8.0)

## 2023-02-13 LAB — HEPATIC FUNCTION PANEL
ALT: 27 U/L (ref 0–53)
AST: 23 U/L (ref 0–37)
Albumin: 4.3 g/dL (ref 3.5–5.2)
Alkaline Phosphatase: 40 U/L (ref 39–117)
Bilirubin, Direct: 0.1 mg/dL (ref 0.0–0.3)
Total Bilirubin: 0.7 mg/dL (ref 0.2–1.2)
Total Protein: 7 g/dL (ref 6.0–8.3)

## 2023-02-13 LAB — BASIC METABOLIC PANEL
BUN: 21 mg/dL (ref 6–23)
CO2: 28 mEq/L (ref 19–32)
Calcium: 9 mg/dL (ref 8.4–10.5)
Chloride: 104 mEq/L (ref 96–112)
Creatinine, Ser: 1.09 mg/dL (ref 0.40–1.50)
GFR: 73.66 mL/min (ref >60.00)
Glucose, Bld: 115 mg/dL — ABNORMAL HIGH (ref 70–99)
Potassium: 4.4 mEq/L (ref 3.5–5.1)
Sodium: 139 mEq/L (ref 135–145)

## 2023-02-13 LAB — LIPID PANEL
Cholesterol: 204 mg/dL — ABNORMAL HIGH (ref 0–200)
HDL: 66.3 mg/dL (ref >39.00)
LDL Cholesterol: 126 mg/dL — ABNORMAL HIGH (ref 0–99)
NonHDL: 137.76
Total CHOL/HDL Ratio: 3
Triglycerides: 57 mg/dL (ref 0.0–149.0)
VLDL: 11.4 mg/dL (ref 0.0–40.0)

## 2023-02-13 LAB — CBC WITH DIFFERENTIAL/PLATELET
Basophils Absolute: 0 10*3/uL (ref 0.0–0.1)
Basophils Relative: 0.4 % (ref 0.0–3.0)
Eosinophils Absolute: 0.1 10*3/uL (ref 0.0–0.7)
Eosinophils Relative: 1.8 % (ref 0.0–5.0)
HCT: 43.8 % (ref 39.0–52.0)
Hemoglobin: 14.8 g/dL (ref 13.0–17.0)
Lymphocytes Relative: 27.2 % (ref 12.0–46.0)
Lymphs Abs: 1.7 10*3/uL (ref 0.7–4.0)
MCHC: 33.8 g/dL (ref 30.0–36.0)
MCV: 84.3 fl (ref 78.0–100.0)
Monocytes Absolute: 0.3 10*3/uL (ref 0.1–1.0)
Monocytes Relative: 5 % (ref 3.0–12.0)
Neutro Abs: 4 10*3/uL (ref 1.4–7.7)
Neutrophils Relative %: 65.6 % (ref 43.0–77.0)
Platelets: 227 10*3/uL (ref 150.0–400.0)
RBC: 5.2 Mil/uL (ref 4.22–5.81)
RDW: 14.3 % (ref 11.5–15.5)
WBC: 6.1 10*3/uL (ref 4.0–10.5)

## 2023-02-13 LAB — PSA: PSA: 1.67 ng/mL (ref 0.10–4.00)

## 2023-02-13 LAB — BRAIN NATRIURETIC PEPTIDE: Pro B Natriuretic peptide (BNP): 58 pg/mL (ref 0.0–100.0)

## 2023-02-13 LAB — TSH: TSH: 1.66 u[IU]/mL (ref 0.35–5.50)

## 2023-02-13 LAB — HEMOGLOBIN A1C: Hgb A1c MFr Bld: 5.8 % (ref 4.6–6.5)

## 2023-02-13 MED ORDER — SILDENAFIL CITRATE 20 MG PO TABS: 80.0000 mg | ORAL_TABLET | Freq: Every day | ORAL | 3 refills | Status: DC | PRN

## 2023-02-13 MED ORDER — ROSUVASTATIN CALCIUM 10 MG PO TABS
10.0000 mg | ORAL_TABLET | Freq: Every day | ORAL | 1 refills | Status: DC
Start: 2023-02-13 — End: 2023-11-16

## 2023-02-13 NOTE — Patient Instructions (Signed)
Health Maintenance, Male Adopting a healthy lifestyle and getting preventive care are important in promoting health and wellness. Ask your health care provider about: The right schedule for you to have regular tests and exams. Things you can do on your own to prevent diseases and keep yourself healthy. What should I know about diet, weight, and exercise? Eat a healthy diet  Eat a diet that includes plenty of vegetables, fruits, low-fat dairy products, and lean protein. Do not eat a lot of foods that are high in solid fats, added sugars, or sodium. Maintain a healthy weight Body mass index (BMI) is a measurement that can be used to identify possible weight problems. It estimates body fat based on height and weight. Your health care provider can help determine your BMI and help you achieve or maintain a healthy weight. Get regular exercise Get regular exercise. This is one of the most important things you can do for your health. Most adults should: Exercise for at least 150 minutes each week. The exercise should increase your heart rate and make you sweat (moderate-intensity exercise). Do strengthening exercises at least twice a week. This is in addition to the moderate-intensity exercise. Spend less time sitting. Even light physical activity can be beneficial. Watch cholesterol and blood lipids Have your blood tested for lipids and cholesterol at 61 years of age, then have this test every 5 years. You may need to have your cholesterol levels checked more often if: Your lipid or cholesterol levels are high. You are older than 61 years of age. You are at high risk for heart disease. What should I know about cancer screening? Many types of cancers can be detected early and may often be prevented. Depending on your health history and family history, you may need to have cancer screening at various ages. This may include screening for: Colorectal cancer. Prostate cancer. Skin cancer. Lung  cancer. What should I know about heart disease, diabetes, and high blood pressure? Blood pressure and heart disease High blood pressure causes heart disease and increases the risk of stroke. This is more likely to develop in people who have high blood pressure readings or are overweight. Talk with your health care provider about your target blood pressure readings. Have your blood pressure checked: Every 3-5 years if you are 18-39 years of age. Every year if you are 40 years old or older. If you are between the ages of 65 and 75 and are a current or former smoker, ask your health care provider if you should have a one-time screening for abdominal aortic aneurysm (AAA). Diabetes Have regular diabetes screenings. This checks your fasting blood sugar level. Have the screening done: Once every three years after age 45 if you are at a normal weight and have a low risk for diabetes. More often and at a younger age if you are overweight or have a high risk for diabetes. What should I know about preventing infection? Hepatitis B If you have a higher risk for hepatitis B, you should be screened for this virus. Talk with your health care provider to find out if you are at risk for hepatitis B infection. Hepatitis C Blood testing is recommended for: Everyone born from 1945 through 1965. Anyone with known risk factors for hepatitis C. Sexually transmitted infections (STIs) You should be screened each year for STIs, including gonorrhea and chlamydia, if: You are sexually active and are younger than 61 years of age. You are older than 61 years of age and your   health care provider tells you that you are at risk for this type of infection. Your sexual activity has changed since you were last screened, and you are at increased risk for chlamydia or gonorrhea. Ask your health care provider if you are at risk. Ask your health care provider about whether you are at high risk for HIV. Your health care provider  may recommend a prescription medicine to help prevent HIV infection. If you choose to take medicine to prevent HIV, you should first get tested for HIV. You should then be tested every 3 months for as long as you are taking the medicine. Follow these instructions at home: Alcohol use Do not drink alcohol if your health care provider tells you not to drink. If you drink alcohol: Limit how much you have to 0-2 drinks a day. Know how much alcohol is in your drink. In the [place] one drink equals one 12 oz bottle of beer (355 mL), one 5 oz glass of wine (148 mL), or one 1 oz glass of hard liquor (44 mL). Lifestyle Do not use any products that contain nicotine or tobacco. These products include cigarettes, chewing tobacco, and vaping devices, such as e-cigarettes. If you need help quitting, ask your health care provider. Do not use street drugs. Do not share needles. Ask your health care provider for help if you need support or information about quitting drugs. General instructions Schedule regular health, dental, and eye exams. Stay current with your vaccines. Tell your health care provider if: You often feel depressed. You have ever been abused or do not feel safe at home. Summary Adopting a healthy lifestyle and getting preventive care are important in promoting health and wellness. Follow your health care provider's instructions about healthy diet, exercising, and getting tested or screened for diseases. Follow your health care provider's instructions on monitoring your cholesterol and blood pressure. This information is not intended to replace advice given to you by your health care provider. Make sure you discuss any questions you have with your health care provider. Document Revised: 12/28/2020 Document Reviewed: 12/28/2020 Elsevier Patient Education  2024 [employer]  

## 2023-02-13 NOTE — Progress Notes (Unsigned)
Subjective:  Patient ID: Matthew Richardson, male    DOB: April 23, 1961  Age: 62 y.o. MRN: [id number]  CC: Annual Exam and Hypertension   HPI Matthew Richardson presents for a CPX and f/up ----  Discussed the use of AI scribe software for clinical note transcription with the patient, who gave verbal consent to proceed.  History of Present Illness   The patient, aged 5, presents for a routine physical examination. The patient denies any symptoms of chest pain or shortness of breath.  The patient's blood pressure has been stable since a recent medication adjustment, with no reported dizziness or lightheadedness. They deny any swelling in the legs or feet and report no regular smoking or drinking habits, only occasional social drinking.  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 They occasionally take Advil for headaches but deny any use of blood thinners. There is no reported bleeding from the nose or blood in the stool or urine.       Outpatient Medications Prior to Visit  Medication Sig Dispense Refill   Cholecalciferol 50 MCG (2000 UT) TABS Take 1 tablet (2,000 Units total) by mouth daily. 90 tablet 1   ELDERBERRY PO Take 1 tablet by mouth daily.     irbesartan (AVAPRO) 300 MG tablet Take 1 tablet by mouth once daily 90 tablet 0   omeprazole (PRILOSEC) 40 MG capsule Take 1 capsule by mouth once daily 90 capsule 0   zinc gluconate 50 MG tablet Take 50 mg by mouth daily.     sildenafil (REVATIO) 20 MG tablet TAKE 4 TABLETS BY MOUTH ONCE DAILY AS NEEDED 60 tablet 0   indapamide (LOZOL) 1.25 MG tablet Take 1 tablet (1.25 mg total) by mouth daily. 90 tablet 0   No facility-administered medications prior to visit.    ROS Review of Systems  Constitutional: Negative.  Negative for chills, diaphoresis and fatigue.  HENT: Negative.    Eyes: Negative.   Respiratory:  Positive for shortness of breath (DOE).  Negative for cough, chest tightness and wheezing.   Cardiovascular:  Negative for chest pain, palpitations and leg swelling.  Gastrointestinal:  Negative for abdominal pain, constipation, diarrhea, nausea and vomiting.  Endocrine: Negative.   Genitourinary:  Negative for difficulty urinating and dysuria.  Musculoskeletal: Negative.  Negative for arthralgias, back pain, myalgias and neck pain.  Skin:  Positive for color change.  Neurological:  Negative for dizziness, weakness and light-headedness.  Hematological:  Negative for adenopathy. Does not bruise/bleed easily.  Psychiatric/Behavioral: Negative.      Objective:  BP 134/82 (BP Location: Left Arm, Patient Position: Sitting, Cuff Size: Large)   Pulse (!) 49   Temp 98 F (36.7 C) (Oral)   Resp 16   Ht 5\' 11"  (1.803 m)   Wt 205 lb (93 kg)   SpO2 98%   BMI 28.59 kg/m   BP Readings from Last 3 Encounters:  02/13/23 134/82  08/02/22 (!) 156/94  02/02/22 128/82    Wt Readings from Last 3 Encounters:  02/13/23 205 lb (93 kg)  08/02/22 198 lb (89.8 kg)  02/02/22 198 lb (89.8 kg)    Physical Exam Vitals reviewed. Exam conducted with a chaperone present Malva Limes).  Constitutional:      Appearance: Normal appearance.  HENT:     Nose: Nose normal.     Mouth/Throat:     Mouth: Mucous membranes are moist.  Eyes:  General: No scleral icterus.    Conjunctiva/sclera: Conjunctivae normal.  Cardiovascular:     Rate and Rhythm: Regular rhythm. Bradycardia present.     Pulses: Normal pulses.     Heart sounds: No murmur heard.    No friction rub. No gallop.     Comments: EKG- SB, 54 bpm Incomplete RBBB Septal infarct pattern - old T wave changes are old No LVH unchanged Pulmonary:     Effort: Pulmonary effort is normal.     Breath sounds: No stridor. No wheezing, rhonchi or rales.  Abdominal:     General: Abdomen is flat.     Palpations: There is no mass.     Tenderness: There is no abdominal tenderness. There is  no guarding.     Hernia: No hernia is present. There is no hernia in the left inguinal area or right inguinal area.  Genitourinary:    Pubic Area: No rash.      Penis: Normal and circumcised.      Testes: Normal.     Epididymis:     Right: Normal.     Left: Normal.     Prostate: Normal. Not enlarged, not tender and no nodules present.     Rectum: Normal. Guaiac result negative. No mass, tenderness, anal fissure, external hemorrhoid or internal hemorrhoid. Normal anal tone.  Musculoskeletal:        General: Normal range of motion.     Right lower leg: No edema.     Left lower leg: No edema.  Lymphadenopathy:     Cervical: No cervical adenopathy.     Lower Body: No right inguinal adenopathy. No left inguinal adenopathy.  Skin:    Findings: Lesion present. No rash.  Neurological:     General: No focal deficit present.     Mental Status: He is alert. Mental status is at baseline.     Deep Tendon Reflexes: Reflexes normal.  Psychiatric:        Mood and Affect: Mood normal.        Behavior: Behavior normal.     Lab Results  Component Value Date   WBC 6.1 02/13/2023   HGB 14.8 02/13/2023   HCT 43.8 02/13/2023   PLT 227.0 02/13/2023   GLUCOSE 115 (H) 02/13/2023   CHOL 204 (H) 02/13/2023   TRIG 57.0 02/13/2023   HDL 66.30 02/13/2023   LDLDIRECT 136.5 03/13/2013   LDLCALC 126 (H) 02/13/2023   ALT 27 02/13/2023   AST 23 02/13/2023   NA 139 02/13/2023   K 4.4 02/13/2023   CL 104 02/13/2023   CREATININE 1.09 02/13/2023   BUN 21 02/13/2023   CO2 28 02/13/2023   TSH 1.66 02/13/2023   PSA 1.67 02/13/2023   INR 1.1 RATIO (H) 07/18/2008   HGBA1C 5.8 02/13/2023    No results found.  Assessment & Plan:   Atherosclerosis of aorta (HCC)- I have asked her to take a statin for cardiovascular risk reduction. -     Lipid panel; Future -     Lipoprotein A (LPA); Future -     Rosuvastatin Calcium; Take 1 tablet (10 mg total) by mouth daily.  Dispense: 90 tablet; Refill:  1  Essential hypertension- His blood pressure is well-controlled. -     EKG 12-Lead -     Basic metabolic panel; Future -     TSH; Future -     Urinalysis, Routine w reflex microscopic; Future -     Hepatic function panel; Future -  CBC with Differential/Platelet; Future  Routine general medical examination at a health care facility- Exam completed, labs reviewed, vaccines reviewed and updated, cancer screenings are up-to-date, patient education was given. -     Lipid panel; Future -     PSA; Future  Grade I diastolic dysfunction- He has a normal volume status. -     Brain natriuretic peptide; Future  Chronic hyperglycemia- A1c is 5.8%. -     Hemoglobin A1c; Future  Erectile dysfunction due to arterial insufficiency -     TSH; Future -     Sildenafil Citrate; Take 4 tablets (80 mg total) by mouth daily as needed.  Dispense: 60 tablet; Refill: 3  Abnormal electrocardiogram (ECG) (EKG)- Will evaluate for CAD with a coronary calcium score. -     Brain natriuretic peptide; Future -     Testosterone Total,Free,Bio, Males; Future -     CT CARDIAC SCORING ([facility] LOCATIONS ONLY); Future  Skin lesion of right leg -     Ambulatory referral to Dermatology  Dyslipidemia, goal LDL below 100 -     Rosuvastatin Calcium; Take 1 tablet (10 mg total) by mouth daily.  Dispense: 90 tablet; Refill: 1     Follow-up: Return in about 6 months (around 08/15/2023).  Sanda Linger, MD

## 2023-02-16 DIAGNOSIS — E785 Hyperlipidemia, unspecified: Secondary | ICD-10-CM | POA: Insufficient documentation

## 2023-02-16 DIAGNOSIS — E7841 Elevated Lipoprotein(a): Secondary | ICD-10-CM | POA: Insufficient documentation

## 2023-02-16 LAB — TESTOSTERONE TOTAL,FREE,BIO, MALES
Albumin: 4.2 g/dL (ref 3.6–5.1)
Sex Hormone Binding: 45 nmol/L (ref 22–77)
Testosterone, Bioavailable: 133.9 ng/dL (ref 110.0–575.0)
Testosterone, Free: 69.5 pg/mL (ref 46.0–224.0)
Testosterone: 638 ng/dL (ref 250–827)

## 2023-02-16 LAB — LIPOPROTEIN A (LPA): Lipoprotein (a): 116 nmol/L — ABNORMAL HIGH (ref <75)

## 2023-03-22 ENCOUNTER — Ambulatory Visit
Admission: RE | Admit: 2023-03-22 | Discharge: 2023-03-22 | Discharge status: Home / Self Care | Payer: No Typology Code available for payment source | Source: Ambulatory Visit | Attending: Internal Medicine | Admitting: Internal Medicine

## 2023-03-22 DIAGNOSIS — R9431 Abnormal electrocardiogram [ECG] [EKG]: Secondary | ICD-10-CM

## 2023-04-17 ENCOUNTER — Other Ambulatory Visit: Payer: Self-pay | Admitting: Internal Medicine

## 2023-04-17 DIAGNOSIS — I10 Essential (primary) hypertension: Secondary | ICD-10-CM

## 2023-04-17 DIAGNOSIS — K219 Gastro-esophageal reflux disease without esophagitis: Secondary | ICD-10-CM

## 2023-07-13 ENCOUNTER — Encounter: Payer: Self-pay | Admitting: Internal Medicine

## 2023-07-14 ENCOUNTER — Other Ambulatory Visit: Payer: Self-pay | Admitting: Internal Medicine

## 2023-07-14 DIAGNOSIS — K219 Gastro-esophageal reflux disease without esophagitis: Secondary | ICD-10-CM

## 2023-07-14 DIAGNOSIS — I10 Essential (primary) hypertension: Secondary | ICD-10-CM

## 2023-09-15 ENCOUNTER — Other Ambulatory Visit: Payer: Self-pay | Admitting: Internal Medicine

## 2023-09-15 DIAGNOSIS — N5201 Erectile dysfunction due to arterial insufficiency: Secondary | ICD-10-CM

## 2023-09-18 ENCOUNTER — Other Ambulatory Visit: Payer: Self-pay | Admitting: Internal Medicine

## 2023-09-18 DIAGNOSIS — N5201 Erectile dysfunction due to arterial insufficiency: Secondary | ICD-10-CM

## 2023-09-18 NOTE — Telephone Encounter (Signed)
Copied from CRM [id number]. Topic: Clinical - Prescription Issue >> Sep 18, 2023  1:46 PM Leavy Cella D wrote: Reason for CRM: Patient called in stating that he has exceeded all of his refills for sildenafil (REVATIO) 20 MG tablet and would need another refill. On our end it shows that patient has 3 refills left that doesn't expire until 01/2024. Please contact pharmacy to update refill info

## 2023-09-19 ENCOUNTER — Other Ambulatory Visit: Payer: Self-pay | Admitting: Internal Medicine

## 2023-09-19 DIAGNOSIS — N5201 Erectile dysfunction due to arterial insufficiency: Secondary | ICD-10-CM

## 2023-09-19 NOTE — Telephone Encounter (Signed)
Copied from CRM [id number]. Topic: Appointments - Scheduling Inquiry for Clinic >> Sep 19, 2023  1:38 PM Mosetta Putt H wrote: Reason for CRM: patient is out of  sildenafil (REVATIO) 20 MG tablet  Pcp soonest availability is not till 3/05 and needs a new prescription

## 2023-10-13 ENCOUNTER — Other Ambulatory Visit: Payer: Self-pay | Admitting: Internal Medicine

## 2023-10-13 DIAGNOSIS — K219 Gastro-esophageal reflux disease without esophagitis: Secondary | ICD-10-CM

## 2023-10-13 DIAGNOSIS — I10 Essential (primary) hypertension: Secondary | ICD-10-CM

## 2023-11-08 ENCOUNTER — Other Ambulatory Visit: Payer: Self-pay | Admitting: Family

## 2023-11-08 DIAGNOSIS — N5201 Erectile dysfunction due to arterial insufficiency: Secondary | ICD-10-CM

## 2023-11-16 ENCOUNTER — Ambulatory Visit (INDEPENDENT_AMBULATORY_CARE_PROVIDER_SITE_OTHER): Payer: No Typology Code available for payment source | Admitting: Internal Medicine

## 2023-11-16 ENCOUNTER — Encounter: Payer: Self-pay | Admitting: Internal Medicine

## 2023-11-16 VITALS — BP 132/80 | HR 52 | Temp 98.0°F | Resp 16 | Ht 71.0 in | Wt 201.4 lb

## 2023-11-16 DIAGNOSIS — G8929 Other chronic pain: Secondary | ICD-10-CM | POA: Diagnosis not present

## 2023-11-16 DIAGNOSIS — Z1211 Encounter for screening for malignant neoplasm of colon: Secondary | ICD-10-CM | POA: Insufficient documentation

## 2023-11-16 DIAGNOSIS — E785 Hyperlipidemia, unspecified: Secondary | ICD-10-CM | POA: Diagnosis not present

## 2023-11-16 DIAGNOSIS — R001 Bradycardia, unspecified: Secondary | ICD-10-CM

## 2023-11-16 DIAGNOSIS — I700 Atherosclerosis of aorta: Secondary | ICD-10-CM

## 2023-11-16 DIAGNOSIS — R739 Hyperglycemia, unspecified: Secondary | ICD-10-CM | POA: Diagnosis not present

## 2023-11-16 DIAGNOSIS — I10 Essential (primary) hypertension: Secondary | ICD-10-CM

## 2023-11-16 DIAGNOSIS — M25511 Pain in right shoulder: Secondary | ICD-10-CM

## 2023-11-16 DIAGNOSIS — N5201 Erectile dysfunction due to arterial insufficiency: Secondary | ICD-10-CM

## 2023-11-16 DIAGNOSIS — K219 Gastro-esophageal reflux disease without esophagitis: Secondary | ICD-10-CM

## 2023-11-16 LAB — URINALYSIS, ROUTINE W REFLEX MICROSCOPIC
Bilirubin Urine: NEGATIVE
Ketones, ur: NEGATIVE
Leukocytes,Ua: NEGATIVE
Nitrite: NEGATIVE
Specific Gravity, Urine: 1.015 (ref 1.000–1.030)
Total Protein, Urine: NEGATIVE
Urine Glucose: NEGATIVE
Urobilinogen, UA: 0.2 (ref 0.0–1.0)
WBC, UA: NONE SEEN (ref 0–?)
pH: 6 (ref 5.0–8.0)

## 2023-11-16 LAB — CBC WITH DIFFERENTIAL/PLATELET
Basophils Absolute: 0 10*3/uL (ref 0.0–0.1)
Basophils Relative: 0.2 % (ref 0.0–3.0)
Eosinophils Absolute: 0 10*3/uL (ref 0.0–0.7)
Eosinophils Relative: 0.1 % (ref 0.0–5.0)
HCT: 43.1 % (ref 39.0–52.0)
Hemoglobin: 15.1 g/dL (ref 13.0–17.0)
Lymphocytes Relative: 26.6 % (ref 12.0–46.0)
Lymphs Abs: 1.3 10*3/uL (ref 0.7–4.0)
MCHC: 35.1 g/dL (ref 30.0–36.0)
MCV: 88 fl (ref 78.0–100.0)
Monocytes Absolute: 0.3 10*3/uL (ref 0.1–1.0)
Monocytes Relative: 6.4 % (ref 3.0–12.0)
Neutro Abs: 3.4 10*3/uL (ref 1.4–7.7)
Neutrophils Relative %: 66.7 % (ref 43.0–77.0)
Platelets: 230 10*3/uL (ref 150.0–400.0)
RBC: 4.89 Mil/uL (ref 4.22–5.81)
RDW: 13.5 % (ref 11.5–15.5)
WBC: 5.1 10*3/uL (ref 4.0–10.5)

## 2023-11-16 LAB — BASIC METABOLIC PANEL WITH GFR
BUN: 21 mg/dL (ref 6–23)
CO2: 30 meq/L (ref 19–32)
Calcium: 9 mg/dL (ref 8.4–10.5)
Chloride: 103 meq/L (ref 96–112)
Creatinine, Ser: 1.05 mg/dL (ref 0.40–1.50)
GFR: 76.63 mL/min (ref >60.00)
Glucose, Bld: 121 mg/dL — ABNORMAL HIGH (ref 70–99)
Potassium: 4.4 meq/L (ref 3.5–5.1)
Sodium: 137 meq/L (ref 135–145)

## 2023-11-16 LAB — HEPATIC FUNCTION PANEL
ALT: 17 U/L (ref 0–53)
AST: 19 U/L (ref 0–37)
Albumin: 4.3 g/dL (ref 3.5–5.2)
Alkaline Phosphatase: 40 U/L (ref 39–117)
Bilirubin, Direct: 0.1 mg/dL (ref 0.0–0.3)
Total Bilirubin: 0.7 mg/dL (ref 0.2–1.2)
Total Protein: 7 g/dL (ref 6.0–8.3)

## 2023-11-16 LAB — HEMOGLOBIN A1C: Hgb A1c MFr Bld: 5.6 % (ref 4.6–6.5)

## 2023-11-16 MED ORDER — ROSUVASTATIN CALCIUM 10 MG PO TABS: 10.0000 mg | ORAL_TABLET | Freq: Every day | ORAL | 1 refills | Status: DC

## 2023-11-16 NOTE — Patient Instructions (Signed)
 Hypertension, Adult High blood pressure (hypertension) is when the force of blood pumping through the arteries is too strong. The arteries are the blood vessels that carry blood from the heart throughout the body. Hypertension forces the heart to work harder to pump blood and may cause arteries to become narrow or stiff. Untreated or uncontrolled hypertension can lead to a heart attack, heart failure, a stroke, kidney disease, and other problems. A blood pressure reading consists of a higher number over a lower number. Ideally, your blood pressure should be below 120/80. The first ("top") number is called the systolic pressure. It is a measure of the pressure in your arteries as your heart beats. The second ("bottom") number is called the diastolic pressure. It is a measure of the pressure in your arteries as the heart relaxes. What are the causes? The exact cause of this condition is not known. There are some conditions that result in high blood pressure. What increases the risk? Certain factors may make you more likely to develop high blood pressure. Some of these risk factors are under your control, including: Smoking. Not getting enough exercise or physical activity. Being overweight. Having too much fat, sugar, calories, or salt (sodium) in your diet. Drinking too much alcohol. Other risk factors include: Having a personal history of heart disease, diabetes, high cholesterol, or kidney disease. Stress. Having a family history of high blood pressure and high cholesterol. Having obstructive sleep apnea. Age. The risk increases with age. What are the signs or symptoms? High blood pressure may not cause symptoms. Very high blood pressure (hypertensive crisis) may cause: Headache. Fast or irregular heartbeats (palpitations). Shortness of breath. Nosebleed. Nausea and vomiting. Vision changes. Severe chest pain, dizziness, and seizures. How is this diagnosed? This condition is diagnosed by  measuring your blood pressure while you are seated, with your arm resting on a flat surface, your legs uncrossed, and your feet flat on the floor. The cuff of the blood pressure monitor will be placed directly against the skin of your upper arm at the level of your heart. Blood pressure should be measured at least twice using the same arm. Certain conditions can cause a difference in blood pressure between your right and left arms. If you have a high blood pressure reading during one visit or you have normal blood pressure with other risk factors, you may be asked to: Return on a different day to have your blood pressure checked again. Monitor your blood pressure at home for 1 week or longer. If you are diagnosed with hypertension, you may have other blood or imaging tests to help your health care provider understand your overall risk for other conditions. How is this treated? This condition is treated by making healthy lifestyle changes, such as eating healthy foods, exercising more, and reducing your alcohol intake. You may be referred for counseling on a healthy diet and physical activity. Your health care provider may prescribe medicine if lifestyle changes are not enough to get your blood pressure under control and if: Your systolic blood pressure is above 130. Your diastolic blood pressure is above 80. Your personal target blood pressure may vary depending on your medical conditions, your age, and other factors. Follow these instructions at home: Eating and drinking  Eat a diet that is high in fiber and potassium, and low in sodium, added sugar, and fat. An example of this eating plan is called the DASH diet. DASH stands for Dietary Approaches to Stop Hypertension. To eat this way: Eat  plenty of fresh fruits and vegetables. Try to fill one half of your plate at each meal with fruits and vegetables. Eat whole grains, such as whole-wheat pasta, brown rice, or whole-grain bread. Fill about one  fourth of your plate with whole grains. Eat or drink low-fat dairy products, such as skim milk or low-fat yogurt. Avoid fatty cuts of meat, processed or cured meats, and poultry with skin. Fill about one fourth of your plate with lean proteins, such as fish, chicken without skin, beans, eggs, or tofu. Avoid pre-made and processed foods. These tend to be higher in sodium, added sugar, and fat. Reduce your daily sodium intake. Many people with hypertension should eat less than 1,500 mg of sodium a day. Do not drink alcohol if: Your health care provider tells you not to drink. You are pregnant, may be pregnant, or are planning to become pregnant. If you drink alcohol: Limit how much you have to: 0-1 drink a day for women. 0-2 drinks a day for men. Know how much alcohol is in your drink. In the [place] one drink equals one 12 oz bottle of beer (355 mL), one 5 oz glass of wine (148 mL), or one 1 oz glass of hard liquor (44 mL). Lifestyle  Work with your health care provider to maintain a healthy body weight or to lose weight. Ask what an ideal weight is for you. Get at least 30 minutes of exercise that causes your heart to beat faster (aerobic exercise) most days of the week. Activities may include walking, swimming, or biking. Include exercise to strengthen your muscles (resistance exercise), such as Pilates or lifting weights, as part of your weekly exercise routine. Try to do these types of exercises for 30 minutes at least 3 days a week. Do not use any products that contain nicotine or tobacco. These products include cigarettes, chewing tobacco, and vaping devices, such as e-cigarettes. If you need help quitting, ask your health care provider. Monitor your blood pressure at home as told by your health care provider. Keep all follow-up visits. This is important. Medicines Take over-the-counter and prescription medicines only as told by your health care provider. Follow directions carefully. Blood  pressure medicines must be taken as prescribed. Do not skip doses of blood pressure medicine. Doing this puts you at risk for problems and can make the medicine less effective. Ask your health care provider about side effects or reactions to medicines that you should watch for. Contact a health care provider if you: Think you are having a reaction to a medicine you are taking. Have headaches that keep coming back (recurring). Feel dizzy. Have swelling in your ankles. Have trouble with your vision. Get help right away if you: Develop a severe headache or confusion. Have unusual weakness or numbness. Feel faint. Have severe pain in your chest or abdomen. Vomit repeatedly. Have trouble breathing. These symptoms may be an emergency. Get help right away. Call 911. Do not wait to see if the symptoms will go away. Do not drive yourself to the hospital. Summary Hypertension is when the force of blood pumping through your arteries is too strong. If this condition is not controlled, it may put you at risk for serious complications. Your personal target blood pressure may vary depending on your medical conditions, your age, and other factors. For most people, a normal blood pressure is less than 120/80. Hypertension is treated with lifestyle changes, medicines, or a combination of both. Lifestyle changes include losing weight, eating a healthy,  low-sodium diet, exercising more, and limiting alcohol. This information is not intended to replace advice given to you by your health care provider. Make sure you discuss any questions you have with your health care provider. Document Revised: 06/15/2021 Document Reviewed: 06/15/2021 Elsevier Patient Education  2024 [employer].

## 2023-11-16 NOTE — Progress Notes (Addendum)
 Subjective:  Patient ID: Matthew Richardson, male    DOB: [birth year]  Age: 63 y.o. MRN: [id number]  CC: Hypertension, Hyperlipidemia, and Gastroesophageal Reflux   HPI Matthew Richardson presents for f/up -----  Discussed the use of AI scribe software for clinical note transcription with the patient, who gave verbal consent to proceed.  History of Present Illness   Matthew Richardson is a 63 year old male who presents with right shoulder pain and for routine follow-up.  He experiences right shoulder pain, described as an aching 'toothache' sensation that worsens by the end of the day. The pain is associated with playing competitive basketball, involving frequent throwing. It is manageable during the day but intensifies by evening, leading to increased ibuprofen use for pain relief and sleep aid.  He has a heart rate typically around 52 beats per minute, which he considers normal and asymptomatic. No chest pain, shortness of breath, dizziness, or lightheadedness during physical activity. He feels tired at the end of the day, attributing it to his activity level, without symptoms like feeling washed out or about to pass out.  He has gastroesophageal reflux disease (GERD) and takes omeprazole regularly. Missing one day of medication is tolerable, but missing two days results in heartburn. No trouble swallowing or painful swallowing reported.  He has not been taking his prescribed statin or fluid pills, preferring to manage cholesterol through diet due to concerns about the statin. He continues to use omeprazole for GERD management.  His mother has been diagnosed with leukemia and an autoimmune disease, raising concerns about potential impacts on his lab work.       Outpatient Medications Prior to Visit  Medication Sig Dispense Refill   Cholecalciferol 50 MCG (2000 UT) TABS Take 1 tablet (2,000 Units total) by mouth daily. 90 tablet 1   sildenafil (REVATIO) 20 MG tablet TAKE 4 TABLETS BY MOUTH ONCE DAILY  AS NEEDED 60 tablet 0   irbesartan (AVAPRO) 300 MG tablet Take 1 tablet by mouth once daily 90 tablet 0   omeprazole (PRILOSEC) 40 MG capsule Take 1 capsule by mouth once daily 90 capsule 0   ELDERBERRY PO Take 1 tablet by mouth daily.     rosuvastatin (CRESTOR) 10 MG tablet Take 1 tablet (10 mg total) by mouth daily. (Patient not taking: Reported on 11/16/2023) 90 tablet 1   zinc gluconate 50 MG tablet Take 50 mg by mouth daily.     No facility-administered medications prior to visit.    ROS Review of Systems  Constitutional: Negative.  Negative for appetite change, chills, diaphoresis and fatigue.  HENT: Negative.  Negative for sore throat and trouble swallowing.   Eyes: Negative.   Respiratory:  Negative for cough, chest tightness, shortness of breath and wheezing.   Cardiovascular:  Negative for chest pain, palpitations and leg swelling.  Gastrointestinal: Negative.  Negative for abdominal pain, constipation, diarrhea, nausea and vomiting.  Endocrine: Negative.   Genitourinary: Negative.  Negative for difficulty urinating.  Musculoskeletal:  Positive for arthralgias. Negative for back pain, myalgias and neck pain.  Skin: Negative.   Neurological:  Negative for dizziness, weakness and headaches.  Hematological:  Negative for adenopathy. Does not bruise/bleed easily.  Psychiatric/Behavioral: Negative.      Objective:  BP 132/80 (BP Location: Left Arm, Patient Position: Sitting, Cuff Size: Normal)   Pulse (!) 52   Temp 98 F (36.7 C) (Oral)   Resp 16   Ht 5\' 11"  (1.803 m)   Wt 201 lb  6.4 oz (91.4 kg)   SpO2 99%   BMI 28.09 kg/m   BP Readings from Last 3 Encounters:  11/16/23 132/80  02/13/23 134/82  08/02/22 (!) 156/94    Wt Readings from Last 3 Encounters:  11/16/23 201 lb 6.4 oz (91.4 kg)  02/13/23 205 lb (93 kg)  08/02/22 198 lb (89.8 kg)    Physical Exam Vitals reviewed.  HENT:     Nose: Nose normal.     Mouth/Throat:     Mouth: Mucous membranes are moist.   Eyes:     General: No scleral icterus.    Conjunctiva/sclera: Conjunctivae normal.  Cardiovascular:     Rate and Rhythm: Bradycardia present.     Heart sounds: No murmur heard.    No friction rub. No gallop.     Comments: EKG --  SB, 51 bpm LAD NS T wave changes Unchanged  Pulmonary:     Effort: Pulmonary effort is normal.     Breath sounds: No stridor. No wheezing, rhonchi or rales.  Abdominal:     General: Abdomen is flat.     Palpations: There is no mass.     Tenderness: There is no abdominal tenderness. There is no guarding.     Hernia: No hernia is present.  Musculoskeletal:        General: Normal range of motion.     Cervical back: Neck supple.     Right lower leg: No edema.     Left lower leg: No edema.  Skin:    General: Skin is warm and dry.  Neurological:     General: No focal deficit present.     Mental Status: He is alert.  Psychiatric:        Mood and Affect: Mood normal.        Behavior: Behavior normal.     Lab Results  Component Value Date   WBC 5.1 11/16/2023   HGB 15.1 11/16/2023   HCT 43.1 11/16/2023   PLT 230.0 11/16/2023   GLUCOSE 121 (H) 11/16/2023   CHOL 204 (H) 02/13/2023   TRIG 57.0 02/13/2023   HDL 66.30 02/13/2023   LDLDIRECT 136.5 03/13/2013   LDLCALC 126 (H) 02/13/2023   ALT 17 11/16/2023   AST 19 11/16/2023   NA 137 11/16/2023   K 4.4 11/16/2023   CL 103 11/16/2023   CREATININE 1.05 11/16/2023   BUN 21 11/16/2023   CO2 30 11/16/2023   TSH 1.05 11/16/2023   PSA 1.67 02/13/2023   INR 1.1 RATIO (H) 07/18/2008   HGBA1C 5.6 11/16/2023    CT CARDIAC SCORING ([facility] LOCATIONS ONLY) Result Date: 03/22/2023 CLINICAL DATA:  Abnormal EKG. * Tracking Code: FCC * EXAM: CT CARDIAC CORONARY ARTERY CALCIUM SCORE TECHNIQUE: Non-contrast imaging through the heart was performed using prospective ECG gating. Image post processing was performed on an independent workstation, allowing for quantitative analysis of the heart and coronary arteries.  Note that this exam targets the heart and the chest was not imaged in its entirety. COMPARISON:  10/19/2017 CTA chest FINDINGS: CORONARY CALCIUM SCORES: Left Main: 0 LAD: 67.2 LCx: 19.3 RCA: 81.2 Total Agatston Score: 168 MESA database percentile: 86 AORTA MEASUREMENTS: Ascending Aorta: 3.7 cm Descending Aorta:3.1 cm OTHER FINDINGS: No pleural fluid.  Clear imaged lungs. Aortic atherosclerosis. Mild cardiomegaly, without pericardial effusion. No imaged thoracic adenopathy. Subcentimeter low-density left hepatic lobe lesions are likely cysts. Normal imaged portions of the spleen, stomach. No acute osseous abnormality. IMPRESSION: 1. Total Agatston score of 168, corresponding to 86th  percentile for age, sex, and race based cohort. 2.  Aortic Atherosclerosis (ICD10-I70.0). Electronically Signed   By: Jeronimo Greaves M.D.   On: 03/22/2023 16:27    Assessment & Plan:  Essential hypertension- BP is well controlled. -     EKG 12-Lead -     TSH; Future -     Urinalysis, Routine w reflex microscopic; Future -     CBC with Differential/Platelet; Future -     Basic metabolic panel with GFR; Future -     Hepatic function panel; Future -     Irbesartan; Take 1 tablet (300 mg total) by mouth daily.  Dispense: 90 tablet; Refill: 1  Bradycardia- He is asx with this. -     EKG 12-Lead -     TSH; Future  Screening for colon cancer -     Ambulatory referral to Gastroenterology  Chronic right shoulder pain -     Ambulatory referral to Sports Medicine  Bradycardia, sinus -     TSH; Future  Atherosclerosis of aorta (HCC) -     Rosuvastatin Calcium; Take 1 tablet (10 mg total) by mouth daily.  Dispense: 90 tablet; Refill: 1  Chronic hyperglycemia -     Hemoglobin A1c; Future  Dyslipidemia, goal LDL below 100- Will restart the statin. -     Hepatic function panel; Future -     Rosuvastatin Calcium; Take 1 tablet (10 mg total) by mouth daily.  Dispense: 90 tablet; Refill: 1  GERD without esophagitis -      Omeprazole; Take 1 capsule (40 mg total) by mouth daily.  Dispense: 90 capsule; Refill: 1     Follow-up: Return in about 4 months (around 03/17/2024).  Sanda Linger, MD

## 2023-11-17 ENCOUNTER — Encounter: Payer: Self-pay | Admitting: Internal Medicine

## 2023-11-17 LAB — TSH: TSH: 1.05 u[IU]/mL (ref 0.35–5.50)

## 2023-11-17 MED ORDER — OMEPRAZOLE 40 MG PO CPDR: 40.0000 mg | DELAYED_RELEASE_CAPSULE | Freq: Every day | ORAL | 1 refills | Status: DC

## 2023-11-17 MED ORDER — IRBESARTAN 300 MG PO TABS
300.0000 mg | ORAL_TABLET | Freq: Every day | ORAL | 1 refills | Status: DC
Start: 2023-11-17 — End: 2024-07-05

## 2023-11-24 ENCOUNTER — Other Ambulatory Visit: Payer: Self-pay | Admitting: Internal Medicine

## 2023-11-24 DIAGNOSIS — N5201 Erectile dysfunction due to arterial insufficiency: Secondary | ICD-10-CM

## 2023-11-24 MED ORDER — SILDENAFIL CITRATE 20 MG PO TABS: ORAL_TABLET | ORAL | 0 refills | Status: DC

## 2023-11-24 NOTE — Telephone Encounter (Signed)
 Copied from CRM [id number]. Topic: Clinical - Medication Refill >> Nov 24, 2023 11:10 AM Drema Balzarine wrote: Most Recent Primary Care Visit:  Provider: Etta Grandchild  Department: [facility]  Visit Type: OFFICE VISIT  Date: 11/16/2023  Medication: sildenafil   Has the patient contacted their pharmacy? Yes (Agent: If no, request that the patient contact the pharmacy for the refill. If patient does not wish to contact the pharmacy document the reason why and proceed with request.) (Agent: If yes, when and what did the pharmacy advise?)  Is this the correct pharmacy for this prescription? Yes If no, delete pharmacy and type the correct one.  This is the patient's preferred pharmacy:  [facility] [address] [place] - [address] [place] [address] Phone: [phone] Fax: [phone]   Has the prescription been filled recently? Yes  Is the patient out of the medication? Yes, for a week   Has the patient been seen for an appointment in the last year OR does the patient have an upcoming appointment? Yes  Can we respond through MyChart? Yes  Agent: Please be advised that Rx refills may take up to 3 business days. We ask that you follow-up with your pharmacy.

## 2023-11-28 ENCOUNTER — Encounter: Payer: Self-pay | Admitting: *Deleted

## 2023-12-13 ENCOUNTER — Encounter: Payer: Self-pay | Admitting: Internal Medicine

## 2024-01-11 ENCOUNTER — Other Ambulatory Visit: Payer: Self-pay | Admitting: Internal Medicine

## 2024-01-11 DIAGNOSIS — N5201 Erectile dysfunction due to arterial insufficiency: Secondary | ICD-10-CM

## 2024-01-11 MED ORDER — SILDENAFIL CITRATE 20 MG PO TABS: ORAL_TABLET | ORAL | 0 refills | Status: DC

## 2024-01-11 NOTE — Telephone Encounter (Unsigned)
 Copied from CRM [id number]. Topic: Clinical - Medication Refill >> Jan 11, 2024  9:28 AM Matthew Richardson wrote: Medication: sildenafil  (REVATIO ) 20 MG tablet - wanted to see if he could get multiple refills instead of having to call in   Has the patient contacted their pharmacy? Yes (Agent: If no, request that the patient contact the pharmacy for the refill. If patient does not wish to contact the pharmacy document the reason why and proceed with request.) (Agent: If yes, when and what did the pharmacy advise?)  This is the patient's preferred pharmacy:  [facility] [address] [place] - [address] [place] [address] Phone: [phone] Fax: [phone]  Is this the correct pharmacy for this prescription? Yes If no, delete pharmacy and type the correct one.   Has the prescription been filled recently? No  Is the patient out of the medication? Yes  Has the patient been seen for an appointment in the last year OR does the patient have an upcoming appointment? Yes  Can we respond through MyChart? Yes  Agent: Please be advised that Rx refills may take up to 3 business days. We ask that you follow-up with your pharmacy.

## 2024-01-11 NOTE — Telephone Encounter (Signed)
 Last Fill: 11/24/23  60 tabs/0 RF Pt requesting addtl refills on next script  Last OV: 11/16/23 Next OV: 03/19/24  Routing to provider for review/authorization.

## 2024-01-23 ENCOUNTER — Ambulatory Visit (AMBULATORY_SURGERY_CENTER)

## 2024-01-23 VITALS — Ht 71.0 in | Wt 185.0 lb

## 2024-01-23 DIAGNOSIS — Z1211 Encounter for screening for malignant neoplasm of colon: Secondary | ICD-10-CM

## 2024-01-23 MED ORDER — NA SULFATE-K SULFATE-MG SULF 17.5-3.13-1.6 GM/177ML PO SOLN: 1.0000 | Freq: Once | ORAL | 0 refills | Status: AC

## 2024-01-23 NOTE — Progress Notes (Signed)

## 2024-02-05 ENCOUNTER — Encounter: Payer: Self-pay | Admitting: Internal Medicine

## 2024-02-19 ENCOUNTER — Encounter: Payer: Self-pay | Admitting: Internal Medicine

## 2024-02-19 ENCOUNTER — Ambulatory Visit (AMBULATORY_SURGERY_CENTER): Admitting: Internal Medicine

## 2024-02-19 VITALS — BP 112/61 | HR 54 | Temp 98.2°F | Resp 18 | Ht 71.0 in | Wt 185.0 lb

## 2024-02-19 DIAGNOSIS — K5730 Diverticulosis of large intestine without perforation or abscess without bleeding: Secondary | ICD-10-CM | POA: Diagnosis not present

## 2024-02-19 DIAGNOSIS — D123 Benign neoplasm of transverse colon: Secondary | ICD-10-CM | POA: Diagnosis not present

## 2024-02-19 DIAGNOSIS — D122 Benign neoplasm of ascending colon: Secondary | ICD-10-CM

## 2024-02-19 DIAGNOSIS — D120 Benign neoplasm of cecum: Secondary | ICD-10-CM

## 2024-02-19 DIAGNOSIS — Z1211 Encounter for screening for malignant neoplasm of colon: Secondary | ICD-10-CM | POA: Diagnosis present

## 2024-02-19 DIAGNOSIS — D124 Benign neoplasm of descending colon: Secondary | ICD-10-CM | POA: Diagnosis not present

## 2024-02-19 DIAGNOSIS — K648 Other hemorrhoids: Secondary | ICD-10-CM | POA: Diagnosis not present

## 2024-02-19 MED ORDER — SODIUM CHLORIDE 0.9 % IV SOLN: 500.0000 mL | INTRAVENOUS | Status: DC

## 2024-02-19 NOTE — Progress Notes (Signed)
 Pt's states no medical or surgical changes since previsit or office visit.

## 2024-02-19 NOTE — Progress Notes (Signed)
 GASTROENTEROLOGY PROCEDURE H&P NOTE   Primary Care Physician: Joshua Debby CROME, MD    Reason for Procedure:  Colon cancer screening  Plan:    Colonoscopy  Patient is appropriate for endoscopic procedure(s) in the ambulatory ([facility]) setting.  The nature of the procedure, as well as the risks, benefits, and alternatives were carefully and thoroughly reviewed with the patient. Ample time for discussion and questions allowed. The patient understood, was satisfied, and agreed to proceed.     HPI: Matthew Richardson is a 63 y.o. male who presents for colonoscopy.  Medical history as below.  Tolerated the prep.  No recent chest pain or shortness of breath.  No abdominal pain today.  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   Eczema    GERD (gastroesophageal reflux disease)    Hepatitis    Hep B - treated   History of kidney stones    passed stones   History of nephrolithiasis    Hypertension    white coat syndrome--denies hypertension   PONV (postoperative nausea and vomiting)    Sleep apnea    does not use cpap    Past Surgical History:  Procedure Laterality Date   TENDON REPAIR Left 01/14/2016   Procedure: LEFT HAND WOUND EXPLORATION WITH TENDON REPAIR;  Surgeon: Prentice Pagan, MD;  Location: [facility] OR;  Service: Orthopedics;  Laterality: Left;   UMBILICAL HERNIA REPAIR N/A 10/14/2020   Procedure: OPEN UMBILICAL HERNIA REPAIR WITH MESH;  Surgeon: Dasie Leonor CROME, MD;  Location: [facility] OR;  Service: General;  Laterality: N/A;   WISDOM TOOTH EXTRACTION  1994    Prior to Admission medications   Medication Sig Start Date End Date Taking? Authorizing Provider  Cholecalciferol  50 MCG (2000 UT) TABS Take 1 tablet (2,000 Units total) by mouth daily. 05/29/19  Yes Joshua Debby CROME, MD  irbesartan  (AVAPRO ) 300 MG tablet Take 1 tablet (300 mg total) by mouth daily. 11/17/23  Yes Joshua Debby CROME, MD  omeprazole  (PRILOSEC) 40 MG capsule Take 1  capsule (40 mg total) by mouth daily. 11/17/23  Yes Joshua Debby CROME, MD  rosuvastatin  (CRESTOR ) 10 MG tablet Take 1 tablet (10 mg total) by mouth daily. 11/16/23  Yes Joshua Debby CROME, MD  sildenafil  (REVATIO ) 20 MG tablet TAKE 4 TABLETS BY MOUTH ONCE DAILY AS NEEDED 01/11/24  Yes Webb, Padonda B, FNP    Current Outpatient Medications  Medication Sig Dispense Refill   Cholecalciferol  50 MCG (2000 UT) TABS Take 1 tablet (2,000 Units total) by mouth daily. 90 tablet 1   irbesartan  (AVAPRO ) 300 MG tablet Take 1 tablet (300 mg total) by mouth daily. 90 tablet 1   omeprazole  (PRILOSEC) 40 MG capsule Take 1 capsule (40 mg total) by mouth daily. 90 capsule 1   rosuvastatin  (CRESTOR ) 10 MG tablet Take 1 tablet (10 mg total) by mouth daily. 90 tablet 1   sildenafil  (REVATIO ) 20 MG tablet TAKE 4 TABLETS BY MOUTH ONCE DAILY AS NEEDED 60 tablet 0   Current Facility-Administered Medications  Medication Dose Route Frequency Provider Last Rate Last Admin   0.9 %  sodium chloride  infusion  500 mL Intravenous Continuous Kalep Full, Gordy HERO, MD        Allergies as of 02/19/2024 - Review Complete 02/19/2024  Allergen Reaction Noted   Singulair  [montelukast  sodium] Nausea Only 12/23/2011   Sulfamethoxazole-trimethoprim Nausea And Vomiting    Sulfonamide derivatives  Nausea And Vomiting    Neosporin [neomycin-bacitracin zn-polymyx] Rash and Other (See Comments) 01/14/2016   Penicillins Rash    Polysporin [bacitracin-polymyxin b] Rash and Other (See Comments) 01/14/2016    Family History  Problem Relation Age of Onset   Diabetes Mother    Colon polyps Paternal Grandfather    Colon cancer Paternal Grandfather        died of colon cancer at age 100   Cancer Neg Hx    Alcohol abuse Neg Hx    Drug abuse Neg Hx    Early death Neg Hx    Hearing loss Neg Hx    Heart disease Neg Hx    Hyperlipidemia Neg Hx    Hypertension Neg Hx    Kidney disease Neg Hx    Stroke Neg Hx    Esophageal cancer Neg Hx    Rectal  cancer Neg Hx    Stomach cancer Neg Hx     Social History   Socioeconomic History   Marital status: Married    Spouse name: Not on file   Number of children: Not on file   Years of education: Not on file   Highest education level: Not on file  Occupational History   Occupation: Astronomer: [employer]  Tobacco Use   Smoking status: Former    Current packs/day: 0.00    Average packs/day: 1 pack/day for 20.0 years (20.0 ttl pk-yrs)    Types: Cigarettes    Start date: 08/22/1981    Quit date: 08/22/2001    Years since quitting: 22.5   Smokeless tobacco: Never  Vaping Use   Vaping status: Never Used  Substance and Sexual Activity   Alcohol use: Yes    Comment: socially-seldom   Drug use: No   Sexual activity: Yes  Other Topics Concern   Not on file  Social History Narrative   Regular Exercise-no   Occupation: Academic librarian            Social Drivers of Corporate investment banker Strain: Not on file  Food Insecurity: Not on file  Transportation Needs: Not on file  Physical Activity: Not on file  Stress: Not on file  Social Connections: Not on file  Intimate Partner Violence: Not on file    Physical Exam: Vital signs in last 24 hours: @BP  122/88   Pulse (!) 47   Temp 98.2 F (36.8 C)   Ht 5' 11 (1.803 m)   Wt 185 lb (83.9 kg)   SpO2 98%   BMI 25.80 kg/m  GEN: NAD EYE: Sclerae anicteric ENT: MMM CV: Non-tachycardic Pulm: CTA b/l GI: Soft, NT/ND NEURO:  Alert & Oriented x 3   Gordy Starch, MD [facility]  02/19/2024 9:09 AM

## 2024-02-19 NOTE — Progress Notes (Signed)
 Called to room to assist during endoscopic procedure.  Patient ID and intended procedure confirmed with present staff. Received instructions for my participation in the procedure from the performing physician.

## 2024-02-19 NOTE — Progress Notes (Signed)
 Vss nad trans to pacu

## 2024-02-19 NOTE — Op Note (Signed)
 [facility] Patient Name: Matthew Richardson Procedure Date: 02/19/2024 9:02 AM MRN: [id number] Endoscopist: Gordy CHRISTELLA Starch , MD, [id number] Age: 63 Referring MD:  Date of Birth: [birth year] Gender: Male Account #: [id number] Procedure:                Colonoscopy Indications:              Screening for colorectal malignant neoplasm, Last                            colonoscopy 10 years ago Medicines:                Monitored Anesthesia Care Procedure:                Pre-Anesthesia Assessment:                           - Prior to the procedure, a History and Physical                            was performed, and patient medications and                            allergies were reviewed. The patient's tolerance of                            previous anesthesia was also reviewed. The risks                            and benefits of the procedure and the sedation                            options and risks were discussed with the patient.                            All questions were answered, and informed consent                            was obtained. Prior Anticoagulants: The patient has                            taken no anticoagulant or antiplatelet agents. ASA                            Grade Assessment: II - A patient with mild systemic                            disease. After reviewing the risks and benefits,                            the patient was deemed in satisfactory condition to                            undergo the procedure.  After obtaining informed consent, the colonoscope                            was passed under direct vision. Throughout the                            procedure, the patient's blood pressure, pulse, and                            oxygen saturations were monitored continuously. The                            CF [id number] #[id number] was introduced through the anus                            and advanced to the terminal ileum.  The colonoscopy                            was performed without difficulty. The patient                            tolerated the procedure well. The quality of the                            bowel preparation was good. The ileocecal valve,                            appendiceal orifice, and rectum were photographed. Scope In: 9:15:47 AM Scope Out: 9:45:20 AM Scope Withdrawal Time: 0 hours 27 minutes 1 second  Total Procedure Duration: 0 hours 29 minutes 33 seconds  Findings:                 The digital rectal exam was normal.                           A 15 mm polyp was found in the ileocecal valve.                            This was on the underside extending towards cecum.                            The polyp was sessile. The polyp was removed with a                            cold snare. Resection and retrieval were complete.                           Two sessile polyps were found in the ascending                            colon. The polyps were 4 to 6 mm in size. These  polyps were removed with a cold snare. Resection                            and retrieval were complete.                           Four sessile polyps were found in the transverse                            colon. The polyps were 3 to 5 mm in size. These                            polyps were removed with a cold snare. Resection                            and retrieval were complete.                           A 7 mm polyp was found in the descending colon. The                            polyp was sessile. The polyp was removed with a                            cold snare. Resection and retrieval were complete.                           A few small-mouthed diverticula were found in the                            sigmoid colon and descending colon.                           Internal hemorrhoids were found during                            retroflexion. The hemorrhoids were  small. Complications:            No immediate complications. Estimated Blood Loss:     Estimated blood loss was minimal. Impression:               - One 15 mm polyp at the ileocecal valve, removed                            with a cold snare. Resected and retrieved.                           - Two 4 to 6 mm polyps in the ascending colon,                            removed with a cold snare. Resected and retrieved.                           - Four  3 to 5 mm polyps in the transverse colon,                            removed with a cold snare. Resected and retrieved.                           - One 7 mm polyp in the descending colon, removed                            with a cold snare. Resected and retrieved.                           - Mild diverticulosis in the sigmoid colon and in                            the descending colon.                           - Small internal hemorrhoids. Recommendation:           - Patient has a contact number available for                            emergencies. The signs and symptoms of potential                            delayed complications were discussed with the                            patient. Return to normal activities tomorrow.                            Written discharge instructions were provided to the                            patient.                           - Resume previous diet.                           - Continue present medications.                           - Await pathology results.                           - Repeat colonoscopy is recommended for                            surveillance. The colonoscopy date will be                            determined after pathology results from today's  exam become available for review. Gordy CHRISTELLA Starch, MD 02/19/2024 9:53:07 AM This report has been signed electronically.

## 2024-02-19 NOTE — Patient Instructions (Signed)
-  Handout on polyps, diverticulosis and hemorrhoids provided -Await pathology results  YOU HAD AN ENDOSCOPIC PROCEDURE TODAY AT THE [facility]:   Refer to the procedure report that was given to you for any specific questions about what was found during the examination.  If the procedure report does not answer your questions, please call your gastroenterologist to clarify.  If you requested that your care partner not be given the details of your procedure findings, then the procedure report has been included in a sealed envelope for you to review at your convenience later.  YOU SHOULD EXPECT: Some feelings of bloating in the abdomen. Passage of more gas than usual.  Walking can help get rid of the air that was put into your GI tract during the procedure and reduce the bloating. If you had a lower endoscopy (such as a colonoscopy or flexible sigmoidoscopy) you may notice spotting of blood in your stool or on the toilet paper. If you underwent a bowel prep for your procedure, you may not have a normal bowel movement for a few days.  Please Note:  You might notice some irritation and congestion in your nose or some drainage.  This is from the oxygen used during your procedure.  There is no need for concern and it should clear up in a day or so.  SYMPTOMS TO REPORT IMMEDIATELY:  Following lower endoscopy (colonoscopy or flexible sigmoidoscopy):  Excessive amounts of blood in the stool  Significant tenderness or worsening of abdominal pains  Swelling of the abdomen that is new, acute  Fever of 100F or higher  For urgent or emergent issues, a gastroenterologist can be reached at any hour by calling [phone]. Do not use MyChart messaging for urgent concerns.    DIET:  We do recommend a small meal at first, but then you may proceed to your regular diet.  Drink plenty of fluids but you should avoid alcoholic beverages for 24 hours.  ACTIVITY:  You should plan to take it easy for the  rest of today and you should NOT DRIVE or use heavy machinery until tomorrow (because of the sedation medicines used during the test).    FOLLOW UP: Our staff will call the number listed on your records the next business day following your procedure.  We will call around 7:15- 8:00 am to check on you and address any questions or concerns that you may have regarding the information given to you following your procedure. If we do not reach you, we will leave a message.     If any biopsies were taken you will be contacted by phone or by letter within the next 1-3 weeks.  Please call us  at [phone] if you have not heard about the biopsies in 3 weeks.    SIGNATURES/CONFIDENTIALITY: You and/or your care partner have signed paperwork which will be entered into your electronic medical record.  These signatures attest to the fact that that the information above on your After Visit Summary has been reviewed and is understood.  Full responsibility of the confidentiality of this discharge information lies with you and/or your care-partner.

## 2024-02-20 ENCOUNTER — Telehealth: Payer: Self-pay

## 2024-02-20 NOTE — Telephone Encounter (Signed)
  Follow up Call-     02/19/2024    8:14 AM  Call back number  Post procedure Call Back phone  # [phone]  Permission to leave phone message Yes     Patient questions:  Do you have a fever, pain , or abdominal swelling? No. Pain Score  0 *  Have you tolerated food without any problems? Yes.    Have you been able to return to your normal activities? Yes.    Do you have any questions about your discharge instructions: Diet   No. Medications  No. Follow up visit  No.  Do you have questions or concerns about your Care? No.  Actions: * If pain score is 4 or above: No action needed, pain <4.

## 2024-02-22 ENCOUNTER — Ambulatory Visit: Payer: Self-pay | Admitting: Internal Medicine

## 2024-02-22 LAB — SURGICAL PATHOLOGY

## 2024-03-07 ENCOUNTER — Other Ambulatory Visit: Payer: Self-pay | Admitting: Family

## 2024-03-07 DIAGNOSIS — N5201 Erectile dysfunction due to arterial insufficiency: Secondary | ICD-10-CM

## 2024-03-19 ENCOUNTER — Ambulatory Visit (INDEPENDENT_AMBULATORY_CARE_PROVIDER_SITE_OTHER): Admitting: Internal Medicine

## 2024-03-19 ENCOUNTER — Ambulatory Visit: Payer: Self-pay | Admitting: Internal Medicine

## 2024-03-19 ENCOUNTER — Encounter: Payer: Self-pay | Admitting: Internal Medicine

## 2024-03-19 VITALS — BP 132/86 | HR 53 | Temp 98.7°F | Resp 16 | Ht 71.0 in | Wt 195.0 lb

## 2024-03-19 DIAGNOSIS — Z0000 Encounter for general adult medical examination without abnormal findings: Secondary | ICD-10-CM | POA: Diagnosis not present

## 2024-03-19 DIAGNOSIS — I10 Essential (primary) hypertension: Secondary | ICD-10-CM | POA: Diagnosis not present

## 2024-03-19 DIAGNOSIS — D2370 Other benign neoplasm of skin of unspecified lower limb, including hip: Secondary | ICD-10-CM | POA: Insufficient documentation

## 2024-03-19 DIAGNOSIS — Z85828 Personal history of other malignant neoplasm of skin: Secondary | ICD-10-CM | POA: Insufficient documentation

## 2024-03-19 DIAGNOSIS — Z23 Encounter for immunization: Secondary | ICD-10-CM | POA: Diagnosis not present

## 2024-03-19 DIAGNOSIS — G8929 Other chronic pain: Secondary | ICD-10-CM

## 2024-03-19 DIAGNOSIS — N5201 Erectile dysfunction due to arterial insufficiency: Secondary | ICD-10-CM

## 2024-03-19 DIAGNOSIS — D2330 Other benign neoplasm of skin of unspecified part of face: Secondary | ICD-10-CM | POA: Insufficient documentation

## 2024-03-19 DIAGNOSIS — D1801 Hemangioma of skin and subcutaneous tissue: Secondary | ICD-10-CM | POA: Insufficient documentation

## 2024-03-19 DIAGNOSIS — Z808 Family history of malignant neoplasm of other organs or systems: Secondary | ICD-10-CM | POA: Insufficient documentation

## 2024-03-19 DIAGNOSIS — C44721 Squamous cell carcinoma of skin of unspecified lower limb, including hip: Secondary | ICD-10-CM | POA: Insufficient documentation

## 2024-03-19 DIAGNOSIS — E785 Hyperlipidemia, unspecified: Secondary | ICD-10-CM

## 2024-03-19 DIAGNOSIS — M25511 Pain in right shoulder: Secondary | ICD-10-CM

## 2024-03-19 LAB — BASIC METABOLIC PANEL WITH GFR
BUN: 19 mg/dL (ref 6–23)
CO2: 24 meq/L (ref 19–32)
Calcium: 8.7 mg/dL (ref 8.4–10.5)
Chloride: 103 meq/L (ref 96–112)
Creatinine, Ser: 0.99 mg/dL (ref 0.40–1.50)
GFR: 82.04 mL/min (ref >60.00)
Glucose, Bld: 123 mg/dL — ABNORMAL HIGH (ref 70–99)
Potassium: 4.1 meq/L (ref 3.5–5.1)
Sodium: 138 meq/L (ref 135–145)

## 2024-03-19 LAB — LIPID PANEL
Cholesterol: 161 mg/dL (ref 0–200)
HDL: 73 mg/dL (ref >39.00)
LDL Cholesterol: 79 mg/dL (ref 0–99)
NonHDL: 87.8
Total CHOL/HDL Ratio: 2
Triglycerides: 42 mg/dL (ref 0.0–149.0)
VLDL: 8.4 mg/dL (ref 0.0–40.0)

## 2024-03-19 LAB — PSA: PSA: 1.68 ng/mL (ref 0.10–4.00)

## 2024-03-19 MED ORDER — CELECOXIB 100 MG PO CAPS: 100.0000 mg | ORAL_CAPSULE | Freq: Two times a day (BID) | ORAL | 1 refills | Status: AC

## 2024-03-19 MED ORDER — CELECOXIB 100 MG PO CAPS: 100.0000 mg | ORAL_CAPSULE | Freq: Two times a day (BID) | ORAL | 1 refills | Status: DC

## 2024-03-19 MED ORDER — SILDENAFIL CITRATE 20 MG PO TABS
ORAL_TABLET | ORAL | 5 refills | Status: AC
Start: 2024-03-19 — End: ?

## 2024-03-19 NOTE — Progress Notes (Signed)
 Subjective:  Patient ID: Matthew Richardson, male    DOB: [birth year]  Age: 63 y.o. MRN: [id number]  CC: Annual Exam, Gastroesophageal Reflux, Hyperlipidemia, and Hypertension   HPI Matthew Richardson presents for a CPX and f/up ---  Discussed the use of AI scribe software for clinical note transcription with the patient, who gave verbal consent to proceed.  History of Present Illness Matthew Richardson is a 63 year old male who presents with shoulder pain following an acute injury.  He experiences soreness in the right shoulder after pulling on a rope that broke, causing his arm to be forcefully thrown down. He does not believe the pain is severe and describes it as similar to a pulled muscle. There is no sensation of a pop or feeling that anything is broken. He is not willing to undergo an x-ray today as he needs to return to work.  He has a history of arthritis, which he feels is exacerbated by cooler weather, particularly affecting his shoulders. He attributes this to wear and tear from his past as an avid softball player, involving extensive throwing. The shoulder pain is not as achy as it was during cooler weather.  He is currently taking medication for cholesterol management and reports no muscle pain associated with it. He recently had a colonoscopy and is due for annual cholesterol and prostate tests.  He reports a recent weight of 198 pounds, noting a slight increase. 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  No sensation of a pop or feeling that anything is broken in his shoulder and no muscle pain from his cholesterol medication.    Outpatient Medications Prior to Visit  Medication Sig Dispense Refill   Cholecalciferol  50 MCG (2000 UT) TABS Take 1 tablet (2,000 Units total) by mouth daily. 90 tablet 1   irbesartan  (AVAPRO ) 300 MG tablet Take 1 tablet (300 mg total) by mouth daily. 90 tablet 1   omeprazole  (PRILOSEC) 40 MG capsule Take 1 capsule (40 mg  total) by mouth daily. 90 capsule 1   rosuvastatin  (CRESTOR ) 10 MG tablet Take 1 tablet (10 mg total) by mouth daily. 90 tablet 1   sildenafil  (REVATIO ) 20 MG tablet TAKE 4 TABLETS BY MOUTH ONCE DAILY AS NEEDED 60 tablet 0   No facility-administered medications prior to visit.    ROS Review of Systems  Constitutional:  Positive for unexpected weight change (wt gain). Negative for appetite change, chills, diaphoresis and fatigue.  HENT: Negative.    Eyes: Negative.   Respiratory:  Negative for cough, chest tightness, shortness of breath and wheezing.   Cardiovascular:  Negative for chest pain, palpitations and leg swelling.  Gastrointestinal: Negative.  Negative for abdominal pain, blood in stool, constipation, diarrhea, nausea and vomiting.  Endocrine: Negative.   Genitourinary: Negative.  Negative for difficulty urinating.  Musculoskeletal:  Positive for arthralgias. Negative for back pain and myalgias.  Neurological:  Negative for dizziness and weakness.  Hematological:  Negative for adenopathy. Does not bruise/bleed easily.    Objective:  BP 132/86 (BP Location: Left Arm, Patient Position: Sitting, Cuff Size: Normal)   Pulse (!) 53   Temp 98.7 F (37.1 C) (Oral)   Resp 16   Ht 5' 11 (1.803 m)   Wt 195 lb (88.5 kg)   SpO2 96%   BMI 27.20 kg/m   BP Readings from Last 3 Encounters:  03/19/24 132/86  02/19/24 112/61  11/16/23 132/80    Wt Readings  from Last 3 Encounters:  03/19/24 195 lb (88.5 kg)  02/19/24 185 lb (83.9 kg)  01/23/24 185 lb (83.9 kg)    Physical Exam Vitals reviewed.  HENT:     Mouth/Throat:     Mouth: Mucous membranes are moist.  Eyes:     General: No scleral icterus.    Conjunctiva/sclera: Conjunctivae normal.  Cardiovascular:     Rate and Rhythm: Normal rate and regular rhythm.     Heart sounds: No murmur heard.    No friction rub. No gallop.  Pulmonary:     Breath sounds: No stridor. No wheezing, rhonchi or rales.  Abdominal:      General: Abdomen is flat.     Palpations: There is no mass.     Tenderness: There is no abdominal tenderness. There is no guarding.     Hernia: No hernia is present.  Genitourinary:    Comments: GU/rectal deferred at his request Musculoskeletal:        General: No swelling. Normal range of motion.     Right shoulder: Normal. No swelling, deformity, effusion, bony tenderness or crepitus. Normal range of motion.     Left shoulder: Normal. No swelling, deformity, effusion, bony tenderness or crepitus. Normal range of motion.     Cervical back: Neck supple.     Right lower leg: No edema.     Left lower leg: No edema.  Lymphadenopathy:     Cervical: No cervical adenopathy.  Skin:    General: Skin is warm and dry.     Findings: No rash.  Neurological:     General: No focal deficit present.     Mental Status: He is alert.  Psychiatric:        Mood and Affect: Mood normal.        Behavior: Behavior normal.     Lab Results  Component Value Date   WBC 5.1 11/16/2023   HGB 15.1 11/16/2023   HCT 43.1 11/16/2023   PLT 230.0 11/16/2023   GLUCOSE 121 (H) 11/16/2023   CHOL 204 (H) 02/13/2023   TRIG 57.0 02/13/2023   HDL 66.30 02/13/2023   LDLDIRECT 136.5 03/13/2013   LDLCALC 126 (H) 02/13/2023   ALT 17 11/16/2023   AST 19 11/16/2023   NA 137 11/16/2023   K 4.4 11/16/2023   CL 103 11/16/2023   CREATININE 1.05 11/16/2023   BUN 21 11/16/2023   CO2 30 11/16/2023   TSH 1.05 11/16/2023   PSA 1.67 02/13/2023   INR 1.1 RATIO (H) 07/18/2008   HGBA1C 5.6 11/16/2023    CT CARDIAC SCORING ([facility] LOCATIONS ONLY) Result Date: 03/22/2023 CLINICAL DATA:  Abnormal EKG. * Tracking Code: FCC * EXAM: CT CARDIAC CORONARY ARTERY CALCIUM  SCORE TECHNIQUE: Non-contrast imaging through the heart was performed using prospective ECG gating. Image post processing was performed on an independent workstation, allowing for quantitative analysis of the heart and coronary arteries. Note that this exam targets the  heart and the chest was not imaged in its entirety. COMPARISON:  10/19/2017 CTA chest FINDINGS: CORONARY CALCIUM  SCORES: Left Main: 0 LAD: 67.2 LCx: 19.3 RCA: 81.2 Total Agatston Score: 168 MESA database percentile: 86 AORTA MEASUREMENTS: Ascending Aorta: 3.7 cm Descending Aorta:3.1 cm OTHER FINDINGS: No pleural fluid.  Clear imaged lungs. Aortic atherosclerosis. Mild cardiomegaly, without pericardial effusion. No imaged thoracic adenopathy. Subcentimeter low-density left hepatic lobe lesions are likely cysts. Normal imaged portions of the spleen, stomach. No acute osseous abnormality. IMPRESSION: 1. Total Agatston score of 168, corresponding to 86th percentile  for age, sex, and race based cohort. 2.  Aortic Atherosclerosis (ICD10-I70.0). Electronically Signed   By: Rockey Kilts M.D.   On: 03/22/2023 16:27    Assessment & Plan:  Essential hypertension -     Basic metabolic panel with GFR; Future  Dyslipidemia, goal LDL below 100 -     Lipid panel; Future  Routine general medical examination at a health care facility -     PSA; Future  Chronic right shoulder pain -     Ambulatory referral to Orthopedic Surgery -     Celecoxib ; Take 1 capsule (100 mg total) by mouth 2 (two) times daily.  Dispense: 180 capsule; Refill: 1  Erectile dysfunction due to arterial insufficiency -     Sildenafil  Citrate; TAKE 4 TABLETS BY MOUTH ONCE DAILY AS NEEDED  Dispense: 60 tablet; Refill: 5  Immunization due -     Pneumococcal conjugate vaccine 20-valent     Follow-up: Return in about 6 months (around 09/19/2024).  Debby Molt, MD

## 2024-03-19 NOTE — Patient Instructions (Signed)
 Health Maintenance, Male  Adopting a healthy lifestyle and getting preventive care are important in promoting health and wellness. Ask your health care provider about:  The right schedule for you to have regular tests and exams.  Things you can do on your own to prevent diseases and keep yourself healthy.  What should I know about diet, weight, and exercise?  Eat a healthy diet    Eat a diet that includes plenty of vegetables, fruits, low-fat dairy products, and lean protein.  Do not eat a lot of foods that are high in solid fats, added sugars, or sodium.  Maintain a healthy weight  Body mass index (BMI) is a measurement that can be used to identify possible weight problems. It estimates body fat based on height and weight. Your health care provider can help determine your BMI and help you achieve or maintain a healthy weight.  Get regular exercise  Get regular exercise. This is one of the most important things you can do for your health. Most adults should:  Exercise for at least 150 minutes each week. The exercise should increase your heart rate and make you sweat (moderate-intensity exercise).  Do strengthening exercises at least twice a week. This is in addition to the moderate-intensity exercise.  Spend less time sitting. Even light physical activity can be beneficial.  Watch cholesterol and blood lipids  Have your blood tested for lipids and cholesterol at 62 years of age, then have this test every 5 years.  You may need to have your cholesterol levels checked more often if:  Your lipid or cholesterol levels are high.  You are older than 61 years of age.  You are at high risk for heart disease.  What should I know about cancer screening?  Many types of cancers can be detected early and may often be prevented. Depending on your health history and family history, you may need to have cancer screening at various ages. This may include screening for:  Colorectal cancer.  Prostate cancer.  Skin cancer.  Lung  cancer.  What should I know about heart disease, diabetes, and high blood pressure?  Blood pressure and heart disease  High blood pressure causes heart disease and increases the risk of stroke. This is more likely to develop in people who have high blood pressure readings or are overweight.  Talk with your health care provider about your target blood pressure readings.  Have your blood pressure checked:  Every 3-5 years if you are 9-95 years of age.  Every year if you are 85 years old or older.  If you are between the ages of 29 and 29 and are a current or former smoker, ask your health care provider if you should have a one-time screening for abdominal aortic aneurysm (AAA).  Diabetes  Have regular diabetes screenings. This checks your fasting blood sugar level. Have the screening done:  Once every three years after age 23 if you are at a normal weight and have a low risk for diabetes.  More often and at a younger age if you are overweight or have a high risk for diabetes.  What should I know about preventing infection?  Hepatitis B  If you have a higher risk for hepatitis B, you should be screened for this virus. Talk with your health care provider to find out if you are at risk for hepatitis B infection.  Hepatitis C  Blood testing is recommended for:  Everyone born from [birth year] through 1964.  Anyone  with known risk factors for hepatitis C.  Sexually transmitted infections (STIs)  You should be screened each year for STIs, including gonorrhea and chlamydia, if:  You are sexually active and are younger than 62 years of age.  You are older than 62 years of age and your health care provider tells you that you are at risk for this type of infection.  Your sexual activity has changed since you were last screened, and you are at increased risk for chlamydia or gonorrhea. Ask your health care provider if you are at risk.  Ask your health care provider about whether you are at high risk for HIV. Your health care provider  may recommend a prescription medicine to help prevent HIV infection. If you choose to take medicine to prevent HIV, you should first get tested for HIV. You should then be tested every 3 months for as long as you are taking the medicine.  Follow these instructions at home:  Alcohol use  Do not drink alcohol if your health care provider tells you not to drink.  If you drink alcohol:  Limit how much you have to 0-2 drinks a day.  Know how much alcohol is in your drink. In the [place] one drink equals one 12 oz bottle of beer (355 mL), one 5 oz glass of wine (148 mL), or one 1 oz glass of hard liquor (44 mL).  Lifestyle  Do not use any products that contain nicotine or tobacco. These products include cigarettes, chewing tobacco, and vaping devices, such as e-cigarettes. If you need help quitting, ask your health care provider.  Do not use street drugs.  Do not share needles.  Ask your health care provider for help if you need support or information about quitting drugs.  General instructions  Schedule regular health, dental, and eye exams.  Stay current with your vaccines.  Tell your health care provider if:  You often feel depressed.  You have ever been abused or do not feel safe at home.  Summary  Adopting a healthy lifestyle and getting preventive care are important in promoting health and wellness.  Follow your health care provider's instructions about healthy diet, exercising, and getting tested or screened for diseases.  Follow your health care provider's instructions on monitoring your cholesterol and blood pressure.  This information is not intended to replace advice given to you by your health care provider. Make sure you discuss any questions you have with your health care provider.  Document Revised: 12/28/2020 Document Reviewed: 12/28/2020  Elsevier Patient Education  2024 [employer].

## 2024-05-13 ENCOUNTER — Other Ambulatory Visit: Payer: Self-pay | Admitting: Internal Medicine

## 2024-05-13 DIAGNOSIS — I700 Atherosclerosis of aorta: Secondary | ICD-10-CM

## 2024-05-13 DIAGNOSIS — E785 Hyperlipidemia, unspecified: Secondary | ICD-10-CM

## 2024-06-17 NOTE — Progress Notes (Deleted)
 Patient ID: Matthew Richardson, male    DOB: 02-Aug-1961, 63 y.o.   MRN: [id number]  HPI Male former smoker followed for allergic rhinitis, EIA, OSA/quit CPAP, complicated by kidney stone, GERD, hepatitis B, HBP NPSG 06/14/12- AHI 9.5 per hour, moderately loud snoring. Weight 190 pounds Office spirometry 10/19/16- WNL- FVC 4.46/ 87%, FEV1 3.29/ 84%, ratio 0.74, 25-75% 2.91/ 86% FENO 10/19/16- 15 (WNL)  ---------------------------------------------------------------------    07/12/21- 63 yoM fomer smoker (20 pack year) followed for allergic rhinitis, EIAsthma, OSA/quit CPAP, complicated by kidney stone, Hep B, GERD, HTN, Atherosclerosis, Gr1DD, -Albuterol  hfa, Trelegy 100,  Covid vax-none Flu vax-today Patient feels good overall, no concerns.  ACT score 25 Here with wife.  He is feeling very well, denying respiratory symptoms.  Has not felt need to use inhalers in a long time.  On review, he did smoke from 1 to 2 packs/day before quitting in 2003. We will check a chest x-ray today while he is here, but considering his stability and lack of acute needs, I suggested it would be simpler for him to be followed by his primary care physician who can refill inhalers if ever needed. He had minimal sleep apnea, did not tolerate CPAP, and has kept his weight down.  This issue can be revisited in the future if needed.  06/20/24-63 yoM fomer smoker (20 pack year) followed for allergic rhinitis, EIAsthma, OSA/quit CPAP, complicated by kidney stone, Hep B, GERD, HTN, Atherosclerosis, Gr1DD, -Albuterol  hfa, Trelegy 100,    Review of Systems-see HPI sign = positive Constitutional:   No-   weight loss, night sweats, fevers, chills, fatigue, lassitude. HEENT:   No-  headaches, difficulty swallowing, tooth/dental problems, sore throat,       No-  sneezing, itching, ear ache, +nasal congestion, post nasal drip,  CV:  No-   chest pain, orthopnea, PND, swelling in lower extremities, anasarca, dizziness,  palpitations Resp:  shortness of breath with exertion or at rest.              No-   productive cough,  No non-productive cough,  No-  coughing up of blood.              No-   change in color of mucus.   wheezing.   Skin: No-   rash or lesions. GI:  No-   heartburn, indigestion, abdominal pain, nausea, vomiting,  GU:  MS:  No-   joint pain or swelling.   Neuro- nothing unusual Psych:  No- change in mood or affect. No depression or anxiety.  No memory loss.     Objective:   Physical Exam General- Alert, Oriented, Affect-appropriate, Distress- none acute; sinus bradycardia.  Skin- rash-none, lesions- none, excoriation- none.  Lymphadenopathy- none Head- atraumatic            Eyes- Gross vision intact, PERRLA, conjunctivae clear secretions            Ears- Hearing, canals normal            Nose- Clear, no-Septal dev, mucus, polyps, erosion, perforation             Throat- Mallampati III-IV , mucosa clear , drainage- none, tonsils- atrophic Neck- flexible , trachea midline, no stridor , thyroid  nl, carotid no bruit Chest - symmetrical excursion , unlabored           Heart/CV- RRR , no murmur , no gallop  , no rub, nl s1 s2                           -  JVD- none , edema- none, stasis changes- none, varices- none           Lung- clear, wheeze- none, cough- none , dullness-none, rub- none           Chest wall-  Abd-  Br/ Gen/ Rectal- Not done, not indicated Extrem- cyanosis- none, clubbing, none, atrophy- none, strength- nl Neuro- grossly intact to observation

## 2024-06-20 ENCOUNTER — Ambulatory Visit: Admitting: Internal Medicine

## 2024-06-20 ENCOUNTER — Encounter: Payer: Self-pay | Admitting: Internal Medicine

## 2024-07-02 ENCOUNTER — Other Ambulatory Visit: Payer: Self-pay | Admitting: Internal Medicine

## 2024-07-02 DIAGNOSIS — I10 Essential (primary) hypertension: Secondary | ICD-10-CM

## 2024-07-02 DIAGNOSIS — K219 Gastro-esophageal reflux disease without esophagitis: Secondary | ICD-10-CM

## 2024-07-11 ENCOUNTER — Ambulatory Visit: Admitting: Internal Medicine

## 2024-08-05 ENCOUNTER — Ambulatory Visit
Admission: EM | Admit: 2024-08-05 | Discharge: 2024-08-05 | Disposition: A | Discharge status: Home / Self Care | Attending: Physician Assistant | Admitting: Physician Assistant

## 2024-08-05 DIAGNOSIS — J069 Acute upper respiratory infection, unspecified: Secondary | ICD-10-CM | POA: Diagnosis not present

## 2024-08-05 DIAGNOSIS — J029 Acute pharyngitis, unspecified: Secondary | ICD-10-CM | POA: Diagnosis not present

## 2024-08-05 DIAGNOSIS — R051 Acute cough: Secondary | ICD-10-CM | POA: Diagnosis not present

## 2024-08-05 LAB — POCT RAPID STREP A (OFFICE): Rapid Strep A Screen: NEGATIVE

## 2024-08-05 LAB — POCT INFLUENZA A/B
Influenza A, POC: NEGATIVE
Influenza B, POC: NEGATIVE

## 2024-08-05 LAB — POC SOFIA SARS ANTIGEN FIA: SARS Coronavirus 2 Ag: NEGATIVE

## 2024-08-05 MED ORDER — ONDANSETRON 4 MG PO TBDP: 4.0000 mg | ORAL_TABLET | Freq: Three times a day (TID) | ORAL | 0 refills | Status: AC | PRN

## 2024-08-05 MED ORDER — PROMETHAZINE-DM 6.25-15 MG/5ML PO SYRP: 5.0000 mL | ORAL_SOLUTION | Freq: Four times a day (QID) | ORAL | 0 refills | Status: AC | PRN

## 2024-08-05 NOTE — ED Triage Notes (Signed)
 Pt c/o cough,congestion,bodyaches & sore throat x1 day. Denies fevers. No OTC meds.

## 2024-08-05 NOTE — ED Provider Notes (Signed)
 [facility]    CSN: [id number] Arrival date & time: 08/05/24  1132      History   Chief Complaint Chief Complaint  Patient presents with   Sore Throat   Cough   Nasal Congestion    HPI Matthew Richardson is a 63 y.o. male presenting for chills, fatigue, aches, sore throat, nausea, cough and congestion since yesterday.  No chest pain, wheezing, shortness of breath.  History of mild asthma but has not felt short of breath.  No history of COPD.  Former smoker.  He has taken OTC meds. Wife is currently ill with similar symptoms and has been sick x 1 week.  Patient says his wife's pulmonologist called doxycycline  in for her.  No other complaints.  HPI  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   Eczema    GERD (gastroesophageal reflux disease)    Hepatitis    Hep B - treated   History of kidney stones    passed stones   History of nephrolithiasis    Hypertension    white coat syndrome--denies hypertension   PONV (postoperative nausea and vomiting)    Sleep apnea    does not use cpap    Patient Active Problem List   Diagnosis Date Noted   Benign neoplasm of skin of lower limb 03/19/2024   Epidermal nevus of face 03/19/2024   Family history of skin cancer 03/19/2024   Hemangioma of skin and subcutaneous tissue 03/19/2024   History of malignant neoplasm of skin 03/19/2024   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 03/19/2024   Bradycardia 11/16/2023   Screening for colon cancer 11/16/2023   Chronic right shoulder pain 11/16/2023   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   Dyslipidemia, goal LDL below 100 02/16/2023   Chronic hyperglycemia 08/02/2022   Bradycardia, sinus 02/02/2022   Grade I diastolic dysfunction 06/17/2019   Abnormal electrocardiogram (ECG) (EKG) 05/29/2019   Vitamin D  deficiency 05/29/2019   Essential hypertension 11/07/2017   Atherosclerosis of aorta 11/07/2017   GERD without  esophagitis 09/20/2017   Erectile dysfunction due to arterial insufficiency 09/20/2017   Kidney stone 07/12/2013   Routine general medical examination at a health care facility 03/13/2013   Obstructive sleep apnea 12/25/2011   Asthma, mild intermittent 04/29/2011    Past Surgical History:  Procedure Laterality Date   TENDON REPAIR Left 01/14/2016   Procedure: LEFT HAND WOUND EXPLORATION WITH TENDON REPAIR;  Surgeon: Prentice Pagan, MD;  Location: [facility] OR;  Service: Orthopedics;  Laterality: Left;   UMBILICAL HERNIA REPAIR N/A 10/14/2020   Procedure: OPEN UMBILICAL HERNIA REPAIR WITH MESH;  Surgeon: Dasie Leonor CROME, MD;  Location: [facility] OR;  Service: General;  Laterality: N/A;   WISDOM TOOTH EXTRACTION  1994       Home Medications    Prior to Admission medications  Medication Sig Start Date End Date Taking? Authorizing Provider  promethazine -dextromethorphan (PROMETHAZINE -DM) 6.25-15 MG/5ML syrup Take 5 mLs by mouth 4 (four) times daily as needed. 08/05/24  Yes Arvis Huxley B, PA-C  celecoxib  (CELEBREX ) 100 MG capsule Take 1 capsule (100 mg total) by mouth 2 (two) times daily. 03/19/24   Joshua Debby CROME, MD  Cholecalciferol  50 MCG (2000 UT) TABS Take 1 tablet (2,000 Units total) by mouth daily. 05/29/19   Joshua Debby CROME, MD  irbesartan  (AVAPRO ) 300 MG tablet Take 1 tablet by mouth once daily 07/05/24  Joshua Debby CROME, MD  omeprazole  (PRILOSEC) 40 MG capsule Take 1 capsule by mouth once daily 07/05/24   Joshua Debby CROME, MD  rosuvastatin  (CRESTOR ) 10 MG tablet Take 1 tablet by mouth once daily 05/13/24   Joshua Debby CROME, MD  sildenafil  (REVATIO ) 20 MG tablet TAKE 4 TABLETS BY MOUTH ONCE DAILY AS NEEDED 03/19/24   Joshua Debby CROME, MD    Family History Family History  Problem Relation Age of Onset   Diabetes Mother    Colon polyps Paternal Grandfather    Colon cancer Paternal Grandfather        died of colon cancer at age 72   Cancer Neg Hx    Alcohol abuse Neg Hx    Drug abuse Neg Hx     Early death Neg Hx    Hearing loss Neg Hx    Heart disease Neg Hx    Hyperlipidemia Neg Hx    Hypertension Neg Hx    Kidney disease Neg Hx    Stroke Neg Hx    Esophageal cancer Neg Hx    Rectal cancer Neg Hx    Stomach cancer Neg Hx     Social History Social History[1]   Allergies   Singulair  [montelukast  sodium], Sulfamethoxazole-trimethoprim, Sulfonamide derivatives, Neosporin [neomycin-bacitracin zn-polymyx], Penicillins, and Polysporin [bacitracin-polymyxin b]   Review of Systems Review of Systems  Constitutional:  Positive for chills and fatigue. Negative for fever.  HENT:  Positive for congestion, rhinorrhea and sore throat. Negative for sinus pressure and sinus pain.   Respiratory:  Positive for cough. Negative for shortness of breath.   Cardiovascular:  Negative for chest pain.  Gastrointestinal:  Positive for nausea. Negative for abdominal pain, diarrhea and vomiting.  Musculoskeletal:  Positive for myalgias.  Neurological:  Negative for weakness, light-headedness and headaches.  Hematological:  Negative for adenopathy.     Physical Exam Triage Vital Signs ED Triage Vitals  Encounter Vitals Group     BP 08/05/24 1144 139/87     Girls Systolic BP Percentile --      Girls Diastolic BP Percentile --      Boys Systolic BP Percentile --      Boys Diastolic BP Percentile --      Pulse Rate 08/05/24 1144 75     Resp 08/05/24 1144 16     Temp 08/05/24 1144 98.9 F (37.2 C)     Temp Source 08/05/24 1144 Oral     SpO2 08/05/24 1144 95 %     Weight 08/05/24 1142 205 lb 8 oz (93.2 kg)     Height --      Head Circumference --      Peak Flow --      Pain Score 08/05/24 1143 3     Pain Loc --      Pain Education --      Exclude from Growth Chart --    No data found.  Updated Vital Signs BP 139/87 (BP Location: Left Arm)   Pulse 75   Temp 98.9 F (37.2 C) (Oral)   Resp 16   Wt 205 lb 8 oz (93.2 kg)   SpO2 95%   BMI 28.66 kg/m      Physical Exam Vitals  and nursing note reviewed.  Constitutional:      General: He is not in acute distress.    Appearance: Normal appearance. He is well-developed. He is not ill-appearing.  HENT:     Head: Normocephalic and atraumatic.     Nose:  Congestion present.     Mouth/Throat:     Mouth: Mucous membranes are moist.     Pharynx: Oropharynx is clear. Posterior oropharyngeal erythema present.  Eyes:     General: No scleral icterus.    Conjunctiva/sclera: Conjunctivae normal.  Cardiovascular:     Rate and Rhythm: Normal rate and regular rhythm.  Pulmonary:     Effort: Pulmonary effort is normal. No respiratory distress.     Breath sounds: Normal breath sounds.  Musculoskeletal:     Cervical back: Neck supple.  Skin:    General: Skin is warm and dry.     Capillary Refill: Capillary refill takes less than 2 seconds.  Neurological:     General: No focal deficit present.     Mental Status: He is alert. Mental status is at baseline.     Motor: No weakness.     Gait: Gait normal.  Psychiatric:        Mood and Affect: Mood normal.        Behavior: Behavior normal.      UC Treatments / Results  Labs (all labs ordered are listed, but only abnormal results are displayed) Labs Reviewed  POC SOFIA SARS ANTIGEN FIA - Normal  POCT RAPID STREP A (OFFICE) - Normal  POCT INFLUENZA A/B - Normal    EKG   Radiology No results found.  Procedures Procedures (including critical care time)  Medications Ordered in UC Medications - No data to display  Initial Impression / Assessment and Plan / UC Course  I have reviewed the triage vital signs and the nursing notes.  Pertinent labs & imaging results that were available during my care of the patient were reviewed by me and considered in my medical decision making (see chart for details).   63 year old male presents for fatigue, chills, cough, congestion, sore throat and bodyaches since yesterday.  Vitals are stable and normal.  He is overall  well-appearing.  No acute distress.  On exam has nasal congestion and mild erythema posterior pharynx.  Chest is clear.  Heart regular rate and rhythm.  COVID, flu and strep testing obtained. All negative.   Viral illness. Supportive care advised. Sent promethazine  DM and zofran . Reviewed typical course of most viral illnesses. Discussed return precautions.   Final Clinical Impressions(s) / UC Diagnoses   Final diagnoses:  Acute cough  Sore throat  Viral upper respiratory tract infection     Discharge Instructions      -Negative strep, COVID and flu testing  URI/COLD SYMPTOMS: Your exam today is consistent with a viral illness. Antibiotics are not indicated at this time. Use medications as directed, including cough syrup, nasal saline, and decongestants. Your symptoms should improve over the next few days and resolve within 7-10 days. Increase rest and fluids. F/u if symptoms worsen or predominate such as sore throat, ear pain, productive cough, shortness of breath, or if you develop high fevers or worsening fatigue over the next several days.       ED Prescriptions     Medication Sig Dispense Auth. Provider   promethazine -dextromethorphan (PROMETHAZINE -DM) 6.25-15 MG/5ML syrup Take 5 mLs by mouth 4 (four) times daily as needed. 118 mL Arvis Jolan NOVAK, PA-C      PDMP not reviewed this encounter.     [1]  Social History Tobacco Use   Smoking status: Former    Current packs/day: 0.00    Average packs/day: 1 pack/day for 20.0 years (20.0 ttl pk-yrs)    Types: Cigarettes  Start date: 08/22/1981    Quit date: 08/22/2001    Years since quitting: 22.9   Smokeless tobacco: Never  Vaping Use   Vaping status: Never Used  Substance Use Topics   Alcohol use: Yes    Comment: socially-seldom   Drug use: No     Arvis Jolan NOVAK, PA-C 08/05/24 1248

## 2024-08-05 NOTE — Discharge Instructions (Signed)
-  Negative strep, COVID and flu testing.   URI/COLD SYMPTOMS: Your exam today is consistent with a viral illness. Antibiotics are not indicated at this time. Use medications as directed, including cough syrup, nasal saline, and decongestants. Your symptoms should improve over the next few days and resolve within 7-10 days. Increase rest and fluids. F/u if symptoms worsen or predominate such as sore throat, ear pain, productive cough, shortness of breath, or if you develop high fevers or worsening fatigue over the next several days.

## 2024-08-09 ENCOUNTER — Other Ambulatory Visit: Payer: Self-pay | Admitting: Internal Medicine

## 2024-08-09 DIAGNOSIS — I700 Atherosclerosis of aorta: Secondary | ICD-10-CM

## 2024-08-09 DIAGNOSIS — E785 Hyperlipidemia, unspecified: Secondary | ICD-10-CM

## 2024-09-19 ENCOUNTER — Ambulatory Visit: Admitting: Internal Medicine

## 2024-10-14 ENCOUNTER — Ambulatory Visit: Admitting: Internal Medicine
